# Patient Record
Sex: Male | Born: 1990 | Race: White | Hispanic: No | Marital: Single | Smoking: Current some day smoker
Health system: Southern US, Community
[De-identification: ages and names within clinical notes are randomized; demographics above are authoritative.]

## PROBLEM LIST (undated history)

## (undated) DIAGNOSIS — F32A Depression, unspecified: Secondary | ICD-10-CM

## (undated) DIAGNOSIS — E119 Type 2 diabetes mellitus without complications: Secondary | ICD-10-CM

## (undated) DIAGNOSIS — I10 Essential (primary) hypertension: Secondary | ICD-10-CM

## (undated) HISTORY — DX: Essential (primary) hypertension: I10

---
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
## 2010-07-26 LAB — URINALYSIS, ROUTINE W REFLEX MICROSCOPIC
Bilirubin Urine: NEGATIVE
Glucose, UA: NEGATIVE mg/dL
Specific Gravity, Urine: >1.03 — ABNORMAL HIGH (ref 1.005–1.030)
pH: 5.5 (ref 5.0–8.0)

## 2011-05-13 ENCOUNTER — Emergency Department (HOSPITAL_COMMUNITY): Payer: Self-pay

## 2011-05-13 ENCOUNTER — Encounter (HOSPITAL_COMMUNITY): Payer: Self-pay

## 2011-05-13 ENCOUNTER — Emergency Department (HOSPITAL_COMMUNITY)
Admission: EM | Admit: 2011-05-13 | Discharge: 2011-05-13 | Disposition: A | Discharge status: Home / Self Care | Payer: Self-pay | Attending: Emergency Medicine | Admitting: Emergency Medicine

## 2011-05-13 DIAGNOSIS — Y93H2 Activity, gardening and landscaping: Secondary | ICD-10-CM | POA: Insufficient documentation

## 2011-05-13 DIAGNOSIS — S43409A Unspecified sprain of unspecified shoulder joint, initial encounter: Secondary | ICD-10-CM

## 2011-05-13 DIAGNOSIS — IMO0001 Reserved for inherently not codable concepts without codable children: Secondary | ICD-10-CM | POA: Insufficient documentation

## 2011-05-13 DIAGNOSIS — IMO0002 Reserved for concepts with insufficient information to code with codable children: Secondary | ICD-10-CM | POA: Insufficient documentation

## 2011-05-13 DIAGNOSIS — X58XXXA Exposure to other specified factors, initial encounter: Secondary | ICD-10-CM | POA: Insufficient documentation

## 2011-05-13 DIAGNOSIS — M549 Dorsalgia, unspecified: Secondary | ICD-10-CM | POA: Insufficient documentation

## 2011-05-13 MED ORDER — OXYCODONE-ACETAMINOPHEN 7.5-325 MG PO TABS: 1.0000 | ORAL_TABLET | ORAL | Status: AC | PRN

## 2011-05-13 MED ORDER — HYDROCODONE-ACETAMINOPHEN 5-325 MG PO TABS
ORAL_TABLET | ORAL | Status: AC
Start: 2011-05-13 — End: 2011-05-13
  Administered 2011-05-13: 2 via ORAL
  Filled 2011-05-13: qty 2

## 2011-05-13 MED ORDER — HYDROCODONE-ACETAMINOPHEN 5-325 MG PO TABS
2.0000 | ORAL_TABLET | Freq: Once | ORAL | Status: AC
  Administered 2011-05-13: 2 via ORAL

## 2011-05-13 NOTE — ED Notes (Signed)
Pain rt shoulder and arm after using a log splitter.

## 2011-05-13 NOTE — ED Notes (Signed)
Pt reports was cutting wood with wood splitter and c/o pain to r arm and mid back.

## 2011-05-17 NOTE — ED Provider Notes (Signed)
History     CSN: [id number]  Arrival date & time 05/13/11  1631   First MD Initiated Contact with Patient 05/13/11 1700      Chief Complaint  Patient presents with  . Arm Pain  . Back Pain    (Consider location/radiation/quality/duration/timing/severity/associated sxs/prior treatment) HPI Comments: Patient c/o pain to his right upper arm and shoulder joint that began after he was using a wood splitter for several hours.  Pain is reproduced with movement, specifically abduction of the right arm.  He denies fall, numbness, weakness or neck pain.    Patient is a 21 y.o. male presenting with arm pain and back pain. The history is provided by the patient. No language interpreter was used.  Arm Pain This is a new problem. The current episode started today. The problem occurs constantly. The problem has been unchanged. Associated symptoms include arthralgias and myalgias. Pertinent negatives include no fever, headaches, joint swelling, neck pain, numbness or weakness. The symptoms are aggravated by twisting (movment and palpation). He has tried nothing for the symptoms. The treatment provided no relief.  Back Pain  Pertinent negatives include no fever, no numbness, no headaches and no weakness.    History reviewed. No pertinent past medical history.  History reviewed. No pertinent past surgical history.  No family history on file.  History  Substance Use Topics  . Smoking status: Current Everyday Smoker  . Smokeless tobacco: Not on file  . Alcohol Use: No      Review of Systems  Constitutional: Negative for fever.  HENT: Negative for neck pain.   Musculoskeletal: Positive for myalgias, back pain and arthralgias. Negative for joint swelling.  Skin: Negative.   Neurological: Negative for weakness, numbness and headaches.  All other systems reviewed and are negative.    Allergies  Aspirin  Home Medications   Current Outpatient Rx  Name Route Sig Dispense Refill  .  OXYCODONE-ACETAMINOPHEN 7.5-325 MG PO TABS Oral Take 1 tablet by mouth every 4 (four) hours as needed for pain. 24 tablet 0    BP 116/86  Pulse 99  Temp(Src) 98.3 F (36.8 C) (Oral)  Resp 18  Ht 6' (1.829 m)  Wt 270 lb (122.471 kg)  BMI 36.62 kg/m2  SpO2 100%  Physical Exam  Nursing note and vitals reviewed. Constitutional: He is oriented to person, place, and time. He appears well-developed and well-nourished. No distress.  HENT:  Head: Normocephalic and atraumatic.  Mouth/Throat: Oropharynx is clear and moist.  Neck: Normal range of motion. Neck supple.  Cardiovascular: Normal rate, regular rhythm and normal heart sounds.   Pulmonary/Chest: Effort normal and breath sounds normal. No respiratory distress.  Musculoskeletal: He exhibits tenderness. He exhibits no edema.       Right shoulder: He exhibits decreased range of motion and tenderness. He exhibits no swelling, no effusion, no crepitus, no deformity, no laceration, normal pulse and normal strength.       Arms:      ttp of the anterior bursa of the right shoulder and muscles surrounding the right scapular border.  Pain also reproduced with abduction of the right arm.  Grip strength is symmetrical   Lymphadenopathy:    He has no cervical adenopathy.  Neurological: He is alert and oriented to person, place, and time. He has normal strength. No sensory deficit. He exhibits normal muscle tone. Coordination and gait normal.  Reflex Scores:      Tricep reflexes are 2+ on the right side and 2+ on the left  side.      Bicep reflexes are 2+ on the right side and 2+ on the left side. Skin: Skin is warm and dry.    ED Course  Procedures (including critical care time)  Dg Shoulder Right  05/13/2011  *RADIOLOGY REPORT*  Clinical Data: History of injury and pain.  RIGHT SHOULDER - 2+ VIEW  Comparison: 08/11/2009.  Findings: Alignment is normal.  Joint spaces are preserved.  No fracture or dislocation is evident.  No soft tissue lesions  are seen.  IMPRESSION: No right shoulder lesion is evident.  Original Report Authenticated By: Crawford Givens, M.D.    1. Shoulder sprain       MDM    Pt agrees to f/u with orthopedics if the pain is not improving.  Likely muscular strain.    Patient / Family / Caregiver understand and agree with initial ED impression and plan with expectations set for ED visit. Pt stable in ED with no significant deterioration in condition.       Samuel Wood L. Samuel Wood, [place] 05/17/11 1704

## 2011-05-18 NOTE — ED Provider Notes (Signed)
Medical screening examination/treatment/procedure(s) were performed by non-physician practitioner and as supervising physician I was immediately available for consultation/collaboration.   Jaselle Pryer, MD 05/18/11 0838 

## 2011-09-03 ENCOUNTER — Emergency Department (HOSPITAL_COMMUNITY)
Admission: EM | Admit: 2011-09-03 | Discharge: 2011-09-03 | Disposition: A | Discharge status: Home / Self Care | Payer: Self-pay | Attending: Emergency Medicine | Admitting: Emergency Medicine

## 2011-09-03 ENCOUNTER — Encounter (HOSPITAL_COMMUNITY): Payer: Self-pay | Admitting: *Deleted

## 2011-09-03 DIAGNOSIS — W1809XA Striking against other object with subsequent fall, initial encounter: Secondary | ICD-10-CM | POA: Insufficient documentation

## 2011-09-03 DIAGNOSIS — F17200 Nicotine dependence, unspecified, uncomplicated: Secondary | ICD-10-CM | POA: Insufficient documentation

## 2011-09-03 DIAGNOSIS — Y990 Civilian activity done for income or pay: Secondary | ICD-10-CM | POA: Insufficient documentation

## 2011-09-03 DIAGNOSIS — M545 Low back pain, unspecified: Secondary | ICD-10-CM | POA: Insufficient documentation

## 2011-09-03 DIAGNOSIS — Y9289 Other specified places as the place of occurrence of the external cause: Secondary | ICD-10-CM | POA: Insufficient documentation

## 2011-09-03 MED ORDER — DIAZEPAM 5 MG PO TABS: 5.0000 mg | ORAL_TABLET | Freq: Two times a day (BID) | ORAL | Status: AC

## 2011-09-03 MED ORDER — OXYCODONE-ACETAMINOPHEN 5-325 MG PO TABS
2.0000 | ORAL_TABLET | Freq: Once | ORAL | Status: AC
  Administered 2011-09-03: 2 via ORAL
  Filled 2011-09-03: qty 2

## 2011-09-03 MED ORDER — DIAZEPAM 5 MG/ML IJ SOLN
5.0000 mg | Freq: Once | INTRAMUSCULAR | Status: AC
  Administered 2011-09-03: 5 mg via INTRAMUSCULAR
  Filled 2011-09-03: qty 2

## 2011-09-03 MED ORDER — OXYCODONE-ACETAMINOPHEN 5-325 MG PO TABS: 1.0000 | ORAL_TABLET | Freq: Four times a day (QID) | ORAL | Status: AC | PRN

## 2011-09-03 NOTE — Discharge Instructions (Signed)
Followup with Primary Care Physician. If you do not have one see the list below.  Use conservative methods at home including heat therapy and cold therapy as we discussed. More information on cold therapy is listed below.  It is not recommended to use heat treatment directly after an acute injury.  SEEK IMMEDIATE MEDICAL ATTENTION IF: New numbness, tingling, weakness, or problem with the use of your arms or legs.  Severe back pain not relieved with medications.  Change in bowel or bladder control.  Increasing pain in any areas of the body (such as chest or abdominal pain).  Shortness of breath, dizziness or fainting.  Nausea (feeling sick to your stomach), vomiting, fever, or sweats.  COLD THERAPY DIRECTIONS:  Ice or gel packs can be used to reduce both pain and swelling. Ice is the most helpful within the first 24 to 48 hours after an injury or flareup from overusing a muscle or joint.  Ice is effective, has very few side effects, and is safe for most people to use.   If you expose your skin to cold temperatures for too long or without the proper protection, you can damage your skin or nerves. Watch for signs of skin damage due to cold.   HOME CARE INSTRUCTIONS  Follow these tips to use ice and cold packs safely.  Place a dry or damp towel between the ice and skin. A damp towel will cool the skin more quickly, so you may need to shorten the time that the ice is used.  For a more rapid response, add gentle compression to the ice.  Ice for no more than 10 to 20 minutes at a time. The bonier the area you are icing, the less time it will take to get the benefits of ice.  Check your skin after 5 minutes to make sure there are no signs of a poor response to cold or skin damage.  Rest 20 minutes or more in between uses.  Once your skin is numb, you can end your treatment. You can test numbness by very lightly touching your skin. The touch should be so light that you do not see the skin dimple from  the pressure of your fingertip. When using ice, most people will feel these normal sensations in this order: cold, burning, aching, and numbness.  Do not use ice on someone who cannot communicate their responses to pain, such as small children or people with dementia.   HOW TO MAKE AN ICE PACK  To make an ice pack, do one of the following:  Place crushed ice or a bag of frozen vegetables in a sealable plastic bag. Squeeze out the excess air. Place this bag inside another plastic bag. Slide the bag into a pillowcase or place a damp towel between your skin and the bag.  Mix 3 parts water with 1 part rubbing alcohol. Freeze the mixture in a sealable plastic bag. When you remove the mixture from the freezer, it will be slushy. Squeeze out the excess air. Place this bag inside another plastic bag. Slide the bag into a pillowcase or place a damp towel between your skin and the bag.   SEEK MEDICAL CARE IF:  You develop white spots on your skin. This may give the skin a blotchy (mottled) appearance.  Your skin turns blue or pale.  Your skin becomes waxy or hard.  Your swelling gets worse.  MAKE SURE YOU:  Understand these instructions.  Will watch your condition.  Will get  help right away if you are not doing well or get worse.

## 2011-09-03 NOTE — ED Notes (Signed)
Pt in c/o lower back pain after lifting logs at work, no history of same, increased pain with movement

## 2011-09-03 NOTE — ED Notes (Signed)
Pt. 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  Pain is in lumbar and sacral area.   Rates pain as a 10.  Denies any tingling or numbness.

## 2011-09-03 NOTE — ED Notes (Signed)
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

## 2011-09-03 NOTE — ED Provider Notes (Signed)
History     CSN: [id number]  Arrival date & time 09/03/11  2132   First MD Initiated Contact with Patient 09/03/11 2153      Chief Complaint  Patient presents with  . Back Pain    (Consider location/radiation/quality/duration/timing/severity/associated sxs/prior treatment) HPI Comments: Patient reports that earlier today he was lifting heavy logs while at work.  While lifting a log he feels that he threw his back out.  He is now having pain across his lower back.  He reports that the pain feels like a pulled muscle.  He has not taken anything for the pain.    Patient is a 21 y.o. male presenting with back pain. The history is provided by the patient.  Back Pain  This is a new problem. The current episode started 3 to 5 hours ago. The problem occurs constantly. The problem has not changed since onset.The pain is associated with lifting heavy objects. The pain is present in the lumbar spine. The quality of the pain is described as stabbing. The pain does not radiate. Pertinent negatives include no fever, no numbness, no abdominal pain, no bowel incontinence, no perianal numbness, no bladder incontinence, no dysuria, no paresthesias, no paresis, no tingling and no weakness. He has tried nothing for the symptoms.    History reviewed. No pertinent past medical history.  History reviewed. No pertinent past surgical history.  History reviewed. No pertinent family history.  History  Substance Use Topics  . Smoking status: Current Everyday Smoker  . Smokeless tobacco: Not on file  . Alcohol Use: No      Review of Systems  Constitutional: Negative for fever and chills.  Gastrointestinal: Negative for nausea, vomiting, abdominal pain and bowel incontinence.  Genitourinary: Negative for bladder incontinence and dysuria.  Musculoskeletal: Positive for back pain. Negative for joint swelling and gait problem.  Skin: Negative for color change.  Neurological: Negative for tingling,  weakness, numbness and paresthesias.    Allergies  Aspirin  Home Medications  No current outpatient prescriptions on file.  BP 127/77  Pulse 73  Temp(Src) 97.6 F (36.4 C) (Oral)  Resp 18  SpO2 99%  Physical Exam  Nursing note and vitals reviewed. Constitutional: He is oriented to person, place, and time. He appears well-developed and well-nourished. No distress.  HENT:  Head: Normocephalic and atraumatic.  Eyes: Conjunctivae and EOM are normal. Pupils are equal, round, and reactive to light. No scleral icterus.  Neck: Normal range of motion and full passive range of motion without pain. Neck supple. No spinous process tenderness and no muscular tenderness present. No rigidity. Normal range of motion present. No Brudzinski's sign noted.  Cardiovascular: Normal rate, regular rhythm and intact distal pulses.  Exam reveals no gallop and no friction rub.   No murmur heard. Pulmonary/Chest: Effort normal and breath sounds normal. No respiratory distress. He has no wheezes. He has no rales. He exhibits no tenderness.  Musculoskeletal:       Cervical back: He exhibits normal range of motion, no tenderness, no bony tenderness and no pain.       Thoracic back: He exhibits no tenderness, no bony tenderness and no pain.       Lumbar back: He exhibits tenderness, bony tenderness and pain. He exhibits no spasm and normal pulse.       Bilateral lower extremities nontender without color change, baseline range of motion of extremities with intact distal pulses, capillary refill less than 2 seconds bilaterally.  Pt has increased pain w  ROM of lumbar spine. Pain w ambulation, no sign of ataxia.  Neurological: He is alert and oriented to person, place, and time. He has normal strength and normal reflexes. No sensory deficit. Gait (no ataxia, slowed and hunched d/t pain ) abnormal.       sensation at baseline for light touch in all 4 distal extremities, motor symmetric & bilateral 5/5   Abduction,adduction,flexion of hips, knee flexion & extension, foot dorsiflexion, foot plantar flexion.  Skin: Skin is warm and dry. No rash noted. He is not diaphoretic. No erythema. No pallor.  Psychiatric: He has a normal mood and affect.    ED Course  Procedures (including critical care time)  Labs Reviewed - No data to display No results found.   No diagnosis found.    MDM  Patient with back pain.  No neurological deficits and normal neuro exam.  Patient can walk but states is painful.  No loss of bowel or bladder control.  No concern for cauda equina.  No fever, night sweats, weight loss, h/o cancer, IVDU.  RICE protocol and pain medicine indicated and discussed with patient. Suspect that pain is muscle strain.  Patient given prescription for muscle relaxer.          Pascal Lux Melbeta, PA-C 09/05/11 [phone]

## 2011-09-09 NOTE — ED Provider Notes (Signed)
History/physical exam/procedure(s) were performed by non-physician practitioner and as supervising physician I was immediately available for consultation/collaboration. I have reviewed all notes and am in agreement with care and plan.   Hilario Quarry, MD 09/09/11 [phone]

## 2011-10-09 ENCOUNTER — Encounter (HOSPITAL_COMMUNITY): Payer: Self-pay | Admitting: *Deleted

## 2011-10-09 ENCOUNTER — Emergency Department (HOSPITAL_COMMUNITY)
Admission: EM | Admit: 2011-10-09 | Discharge: 2011-10-09 | Disposition: A | Discharge status: Home / Self Care | Payer: Self-pay | Attending: Emergency Medicine | Admitting: Emergency Medicine

## 2011-10-09 DIAGNOSIS — M5431 Sciatica, right side: Secondary | ICD-10-CM

## 2011-10-09 DIAGNOSIS — Y990 Civilian activity done for income or pay: Secondary | ICD-10-CM | POA: Insufficient documentation

## 2011-10-09 DIAGNOSIS — X500XXA Overexertion from strenuous movement or load, initial encounter: Secondary | ICD-10-CM | POA: Insufficient documentation

## 2011-10-09 DIAGNOSIS — M5430 Sciatica, unspecified side: Secondary | ICD-10-CM | POA: Insufficient documentation

## 2011-10-09 DIAGNOSIS — S39012A Strain of muscle, fascia and tendon of lower back, initial encounter: Secondary | ICD-10-CM

## 2011-10-09 DIAGNOSIS — Y9269 Other specified industrial and construction area as the place of occurrence of the external cause: Secondary | ICD-10-CM | POA: Insufficient documentation

## 2011-10-09 DIAGNOSIS — S335XXA Sprain of ligaments of lumbar spine, initial encounter: Secondary | ICD-10-CM | POA: Insufficient documentation

## 2011-10-09 MED ORDER — PREDNISONE 20 MG PO TABS
60.0000 mg | ORAL_TABLET | Freq: Once | ORAL | Status: AC
  Administered 2011-10-09: 60 mg via ORAL
  Filled 2011-10-09: qty 3

## 2011-10-09 MED ORDER — HYDROCODONE-ACETAMINOPHEN 5-325 MG PO TABS: ORAL_TABLET | ORAL | Status: DC

## 2011-10-09 MED ORDER — HYDROCODONE-ACETAMINOPHEN 5-325 MG PO TABS
1.0000 | ORAL_TABLET | Freq: Once | ORAL | Status: AC
  Administered 2011-10-09: 1 via ORAL
  Filled 2011-10-09: qty 1

## 2011-10-09 MED ORDER — PREDNISONE 50 MG PO TABS: ORAL_TABLET | ORAL | Status: AC

## 2011-10-09 NOTE — ED Notes (Signed)
Pain in low back and down rt leg.  After trying to pick up a log at work

## 2011-10-09 NOTE — Discharge Instructions (Signed)
Back Pain, Adult Low back pain is very common. About 1 in 5 people have back pain.The cause of low back pain is rarely dangerous. The pain often gets better over time.About half of people with a sudden onset of back pain feel better in just 2 weeks. About 8 in 10 people feel better by 6 weeks.  CAUSES Some common causes of back pain include:  Strain of the muscles or ligaments supporting the spine.   Wear and tear (degeneration) of the spinal discs.   Arthritis.   Direct injury to the back.  DIAGNOSIS Most of the time, the direct cause of low back pain is not known.However, back pain can be treated effectively even when the exact cause of the pain is unknown.Answering your caregiver's questions about your overall health and symptoms is one of the most accurate ways to make sure the cause of your pain is not dangerous. If your caregiver needs more information, he or she may order lab work or imaging tests (X-rays or MRIs).However, even if imaging tests show changes in your back, this usually does not require surgery. HOME CARE INSTRUCTIONS For many people, back pain returns.Since low back pain is rarely dangerous, it is often a condition that people can learn to manageon their own.   Remain active. It is stressful on the back to sit or stand in one place. Do not sit, drive, or stand in one place for more than 30 minutes at a time. Take short walks on level surfaces as soon as pain allows.Try to increase the length of time you walk each day.   Do not stay in bed.Resting more than 1 or 2 days can delay your recovery.   Do not avoid exercise or work.Your body is made to move.It is not dangerous to be active, even though your back may hurt.Your back will likely heal faster if you return to being active before your pain is gone.   Pay attention to your body when you bend and lift. Many people have less discomfortwhen lifting if they bend their knees, keep the load close to their  bodies,and avoid twisting. Often, the most comfortable positions are those that put less stress on your recovering back.   Find a comfortable position to sleep. Use a firm mattress and lie on your side with your knees slightly bent. If you lie on your back, put a pillow under your knees.   Only take over-the-counter or prescription medicines as directed by your caregiver. Over-the-counter medicines to reduce pain and inflammation are often the most helpful.Your caregiver may prescribe muscle relaxant drugs.These medicines help dull your pain so you can more quickly return to your normal activities and healthy exercise.   Put ice on the injured area.   Put ice in a plastic bag.   Place a towel between your skin and the bag.   Leave the ice on for 15 to 20 minutes, 3 to 4 times a day for the first 2 to 3 days. After that, ice and heat may be alternated to reduce pain and spasms.   Ask your caregiver about trying back exercises and gentle massage. This may be of some benefit.   Avoid feeling anxious or stressed.Stress increases muscle tension and can worsen back pain.It is important to recognize when you are anxious or stressed and learn ways to manage it.Exercise is a great option.  SEEK MEDICAL CARE IF:  You have pain that is not relieved with rest or medicine.   You have   pain that does not improve in 1 week.   You have new symptoms.   You are generally not feeling well.  SEEK IMMEDIATE MEDICAL CARE IF:   You have pain that radiates from your back into your legs.   You develop new bowel or bladder control problems.   You have unusual weakness or numbness in your arms or legs.   You develop nausea or vomiting.   You develop abdominal pain.   You feel faint.  Document Released: 04/06/2005 Document Revised: 03/26/2011 Document Reviewed: 08/25/2010 [facility] Patient Information 2012 [place], [place].   Take the meds as directed.  Do NOT take any NSAID's while taking the  prednisone.  Apply ice 20-30 min several times daily to lower back.  Follow up with either dr. Hilda Lias or Romeo Apple as needed.

## 2011-10-09 NOTE — ED Provider Notes (Signed)
History     CSN: [id number]  Arrival date & time 10/09/11  1401   First MD Initiated Contact with Patient 10/09/11 1429      Chief Complaint  Patient presents with  . 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  Radiation to right knee.  Worse with movement.  Worse since taking hot shower.  Patient is a 21 y.o. male presenting with back pain. The history is provided by the patient. No language interpreter was used.  Back Pain  This is a new problem. The current episode started 3 to 5 hours ago. The problem occurs constantly. The problem has been gradually worsening. The pain is associated with lifting heavy objects. The pain is severe. The symptoms are aggravated by bending, twisting and certain positions. Associated symptoms include numbness. Pertinent negatives include no fever and no weakness. He has tried heat for the symptoms. The treatment provided no relief.    History reviewed. No pertinent past medical history.  History reviewed. No pertinent past surgical history.  History reviewed. No pertinent family history.  History  Substance Use Topics  . Smoking status: Current Everyday Smoker  . Smokeless tobacco: Not on file  . Alcohol Use: No      Review of Systems  Constitutional: Negative for fever.  Musculoskeletal: Positive for back pain.  Neurological: Positive for numbness. Negative for weakness.  All other systems reviewed and are negative.    Allergies  Chantix and Aspirin  Home Medications   Current Outpatient Rx  Name Route Sig Dispense Refill  . HYDROCODONE-ACETAMINOPHEN 5-325 MG PO TABS  One tab po q 4-6 hrs prn pain 20 tablet 0  . PREDNISONE 50 MG PO TABS  One tab po QD 6 tablet 0    BP 128/72  Pulse 106  Temp 97.3 F (36.3 C) (Oral)  Resp 20   Ht 6' (1.829 m)  Wt 275 lb (124.739 kg)  BMI 37.30 kg/m2  SpO2 100%  Physical Exam  Nursing note and vitals reviewed. Constitutional: He is oriented to person, place, and time. He appears well-developed and well-nourished.  HENT:  Head: Normocephalic and atraumatic.  Eyes: EOM are normal.  Neck: Normal range of motion.  Cardiovascular: Normal rate, regular rhythm, normal heart sounds and intact distal pulses.   Pulmonary/Chest: Effort normal and breath sounds normal. No respiratory distress.  Abdominal: Soft. He exhibits no distension. There is no tenderness.  Musculoskeletal: Normal range of motion.       Back:  Neurological: He is alert and oriented to person, place, and time.  Skin: Skin is warm and dry.  Psychiatric: He has a normal mood and affect. Judgment normal.    ED Course  Procedures (including critical care time)  Labs Reviewed - No data to display No results found.   1. Lumbar strain   2. Right sided sciatica       MDM  Doubt fx.  Exam c/w muscle strain and sciatica. rx-prednisone 50 mg , 6 rx- hydrocodone, 20 Ice F/u with ortho prn        Worthy Rancher, [place] 10/09/11 1446

## 2011-10-09 NOTE — ED Provider Notes (Signed)
Medical screening examination/treatment/procedure(s) were performed by non-physician practitioner and as supervising physician I was immediately available for consultation/collaboration.   Shelda Jakes, MD 10/09/11 1447

## 2011-10-27 ENCOUNTER — Encounter (HOSPITAL_COMMUNITY): Payer: Self-pay | Admitting: Emergency Medicine

## 2011-10-27 ENCOUNTER — Emergency Department (HOSPITAL_COMMUNITY)
Admission: EM | Admit: 2011-10-27 | Discharge: 2011-10-27 | Disposition: A | Discharge status: Home / Self Care | Payer: Self-pay | Attending: Emergency Medicine | Admitting: Emergency Medicine

## 2011-10-27 DIAGNOSIS — S39012A Strain of muscle, fascia and tendon of lower back, initial encounter: Secondary | ICD-10-CM

## 2011-10-27 DIAGNOSIS — S335XXA Sprain of ligaments of lumbar spine, initial encounter: Secondary | ICD-10-CM | POA: Insufficient documentation

## 2011-10-27 DIAGNOSIS — X58XXXA Exposure to other specified factors, initial encounter: Secondary | ICD-10-CM | POA: Insufficient documentation

## 2011-10-27 DIAGNOSIS — F17200 Nicotine dependence, unspecified, uncomplicated: Secondary | ICD-10-CM | POA: Insufficient documentation

## 2011-10-27 MED ORDER — MORPHINE SULFATE 10 MG/ML IJ SOLN
INTRAMUSCULAR | Status: AC
  Administered 2011-10-27: 10 mg
  Filled 2011-10-27: qty 1

## 2011-10-27 MED ORDER — BACLOFEN 10 MG PO TABS: 10.0000 mg | ORAL_TABLET | Freq: Three times a day (TID) | ORAL | Status: DC

## 2011-10-27 MED ORDER — METHOCARBAMOL 500 MG PO TABS
1000.0000 mg | ORAL_TABLET | Freq: Once | ORAL | Status: AC
  Administered 2011-10-27: 1000 mg via ORAL
  Filled 2011-10-27: qty 2

## 2011-10-27 MED ORDER — MORPHINE SULFATE 2 MG/ML IJ SOLN: 8.0000 mg | Freq: Once | INTRAMUSCULAR | Status: DC

## 2011-10-27 MED ORDER — HYDROCODONE-ACETAMINOPHEN 5-325 MG PO TABS: 1.0000 | ORAL_TABLET | ORAL | Status: DC | PRN

## 2011-10-27 MED ORDER — ONDANSETRON HCL 4 MG PO TABS
4.0000 mg | ORAL_TABLET | Freq: Once | ORAL | Status: AC
  Administered 2011-10-27: 4 mg via ORAL
  Filled 2011-10-27: qty 1

## 2011-10-27 NOTE — ED Provider Notes (Signed)
History     CSN: [id number]  Arrival date & time 10/27/11  1911   First MD Initiated Contact with Patient 10/27/11 2116      Chief Complaint  Patient presents with  . Back Pain    (Consider location/radiation/quality/duration/timing/severity/associated sxs/prior treatment) Patient is a 21 y.o. male presenting with back pain. The history is provided by the patient.  Back Pain  This is a new problem. The current episode started 1 to 2 hours ago. The problem occurs constantly. The problem has not changed since onset.The pain is associated with lifting heavy objects. The quality of the pain is described as shooting and aching. The pain does not radiate. The pain is severe. The symptoms are aggravated by certain positions. The pain is the same all the time. Pertinent negatives include no chest pain, no abdominal pain and no dysuria. He has tried nothing for the symptoms.    History reviewed. No pertinent past medical history.  History reviewed. No pertinent past surgical history.  No family history on file.  History  Substance Use Topics  . Smoking status: Current Everyday Smoker  . Smokeless tobacco: Not on file  . Alcohol Use: No      Review of Systems  Constitutional: Negative for activity change.       All ROS Neg except as noted in HPI  HENT: Negative for nosebleeds and neck pain.   Eyes: Negative for photophobia and discharge.  Respiratory: Positive for cough. Negative for shortness of breath and wheezing.   Cardiovascular: Negative for chest pain and palpitations.  Gastrointestinal: Negative for abdominal pain and blood in stool.  Genitourinary: Negative for dysuria, frequency and hematuria.  Musculoskeletal: Positive for back pain. Negative for arthralgias.  Skin: Negative.   Neurological: Negative for dizziness, seizures and speech difficulty.  Psychiatric/Behavioral: Negative for hallucinations and confusion.    Allergies  Chantix and Aspirin  Home Medications    Current Outpatient Rx  Name Route Sig Dispense Refill  . HYDROCODONE-ACETAMINOPHEN 5-325 MG PO TABS  One tab po q 4-6 hrs prn pain 20 tablet 0    BP 118/80  Pulse 96  Temp 99.6 F (37.6 C) (Oral)  Resp 14  Ht 6' (1.829 m)  Wt 280 lb (127.007 kg)  BMI 37.97 kg/m2  SpO2 98%  Physical Exam  Nursing note and vitals reviewed. Constitutional: He is oriented to person, place, and time. He appears well-developed and well-nourished.  Non-toxic appearance.  HENT:  Head: Normocephalic.  Right Ear: Tympanic membrane and external ear normal.  Left Ear: Tympanic membrane and external ear normal.  Eyes: EOM and lids are normal. Pupils are equal, round, and reactive to light.  Neck: Normal range of motion. Neck supple. Carotid bruit is not present.  Cardiovascular: Normal rate, regular rhythm, normal heart sounds, intact distal pulses and normal pulses.   Pulmonary/Chest: Breath sounds normal. No respiratory distress.  Abdominal: Soft. Bowel sounds are normal. There is no tenderness. There is no guarding.  Musculoskeletal: Normal range of motion.       Spasm with attempted ROM of the lumbar spine and paraspinal area on the right. No palpable step off.  Lymphadenopathy:       Head (right side): No submandibular adenopathy present.       Head (left side): No submandibular adenopathy present.    He has no cervical adenopathy.  Neurological: He is alert and oriented to person, place, and time. He has normal strength. No cranial nerve deficit or sensory deficit. He exhibits  normal muscle tone. Coordination normal.  Skin: Skin is warm and dry.  Psychiatric: He has a normal mood and affect. His speech is normal.    ED Course  Procedures (including critical care time)  Labs Reviewed - No data to display No results found.   No diagnosis found.    MDM  I have reviewed nursing notes, vital signs, and all appropriate lab and imaging results for this patient. Pain much improved after IM  morphine and po robaxin. Pt to alternate heat and ice. Rx for baclofen and norco 5mg  given to the patient.       Samuel Wood, [place] 11/09/11 1640

## 2011-10-27 NOTE — ED Notes (Signed)
Discharge instructions reviewed with pt, questions answered. Pt verbalized understanding.  

## 2011-10-27 NOTE — ED Notes (Signed)
States he was lifting a heavy object about 1 hour ago and feels like he has injured his lower back.

## 2011-11-02 ENCOUNTER — Emergency Department (HOSPITAL_COMMUNITY): Payer: Self-pay

## 2011-11-02 ENCOUNTER — Encounter (HOSPITAL_COMMUNITY): Payer: Self-pay | Admitting: *Deleted

## 2011-11-02 ENCOUNTER — Emergency Department (HOSPITAL_COMMUNITY)
Admission: EM | Admit: 2011-11-02 | Discharge: 2011-11-02 | Disposition: A | Discharge status: Home / Self Care | Payer: Self-pay | Attending: Emergency Medicine | Admitting: Emergency Medicine

## 2011-11-02 DIAGNOSIS — M545 Low back pain, unspecified: Secondary | ICD-10-CM | POA: Insufficient documentation

## 2011-11-02 DIAGNOSIS — G8929 Other chronic pain: Secondary | ICD-10-CM | POA: Insufficient documentation

## 2011-11-02 DIAGNOSIS — F17200 Nicotine dependence, unspecified, uncomplicated: Secondary | ICD-10-CM | POA: Insufficient documentation

## 2011-11-02 MED ORDER — CYCLOBENZAPRINE HCL 10 MG PO TABS: 10.0000 mg | ORAL_TABLET | Freq: Three times a day (TID) | ORAL | Status: AC | PRN

## 2011-11-02 MED ORDER — HYDROCODONE-ACETAMINOPHEN 5-325 MG PO TABS: ORAL_TABLET | ORAL | Status: AC

## 2011-11-02 MED ORDER — OXYCODONE-ACETAMINOPHEN 5-325 MG PO TABS
1.0000 | ORAL_TABLET | Freq: Once | ORAL | Status: AC
  Administered 2011-11-02: 1 via ORAL
  Filled 2011-11-02: qty 1

## 2011-11-02 NOTE — ED Provider Notes (Signed)
History   This chart was scribed for Samuel Anger, DO by Samuel Wood. The patient was seen in room APA16A/APA16A.      CSN: [id number]  Arrival date & time 11/02/11  1620   First MD Initiated Contact with Patient 11/02/11 1716      Chief Complaint  Patient presents with  . Back Pain     HPI Pt was seen at 1735. Samuel Wood is a 21 y.o. male who presents to the Emergency Department complaining of gradual onset and persistence of constant acute flair of his chronic right sided lower back "pain" that began approx 2 hours ago.  Pt states he was swimming with friends, and he [place] and [place]ed on a log." States he "landed on my back."  Denies any change in his usual chronic pain pattern.  Pain worsens with palpation of the area and body position changes. Denies incont/retention of bowel or bladder, no saddle anesthesia, no focal motor weakness, no tingling/numbness in extremities, no fevers.   The symptoms have been associated with no other complaints. The patient has a significant history of similar symptoms previously, recently being evaluated for this complaint and multiple prior evals for same.       History reviewed. No pertinent past medical history.  History reviewed. No pertinent past surgical history.   History  Substance Use Topics  . Smoking status: Current Everyday Smoker  . Smokeless tobacco: Not on file  . Alcohol Use: No     Review of Systems ROS: Statement: All systems negative except as marked or noted in the HPI; Constitutional: Negative for fever and chills. ; ; Eyes: Negative for eye pain, redness and discharge. ; ; ENMT: Negative for ear pain, hoarseness, nasal congestion, sinus pressure and sore throat. ; ; Cardiovascular: Negative for chest pain, palpitations, diaphoresis, dyspnea and peripheral edema. ; ; Respiratory: Negative for cough, wheezing and stridor. ; ; Gastrointestinal: Negative for nausea, vomiting, diarrhea,  abdominal pain, blood in stool, hematemesis, jaundice and rectal bleeding. . ; ; Genitourinary: Negative for dysuria, flank pain and hematuria. ; ; Musculoskeletal: +LBP.  Negative for neck pain. Negative for swelling and trauma.; ; Skin: Negative for pruritus, rash, abrasions, blisters, bruising and skin lesion.; ; Neuro: Negative for headache, lightheadedness and neck stiffness. Negative for weakness, altered level of consciousness , altered mental status, extremity weakness, paresthesias, involuntary movement, seizure and syncope.      Allergies  Chantix and Aspirin  Home Medications   Current Outpatient Rx  Name Route Sig Dispense Refill  . BACLOFEN 10 MG PO TABS Oral Take 1 tablet (10 mg total) by mouth 3 (three) times daily. 21 each 0  . HYDROCODONE-ACETAMINOPHEN 5-325 MG PO TABS  One tab po q 4-6 hrs prn pain 20 tablet 0  . HYDROCODONE-ACETAMINOPHEN 5-325 MG PO TABS Oral Take 1 tablet by mouth every 4 (four) hours as needed for pain. 15 tablet 0    BP 118/63  Pulse 72  Temp 97.4 F (36.3 C)  Resp 20  Ht 6' (1.829 m)  Wt 280 lb (127.007 kg)  BMI 37.97 kg/m2  SpO2 100%  Physical Exam 1740: Physical examination:  Nursing notes reviewed; Vital signs and O2 SAT reviewed;  Constitutional: Well developed, Well nourished, Well hydrated, In no acute distress; Head:  Normocephalic, atraumatic; Eyes: EOMI, PERRL, No scleral icterus; ENMT: Mouth and pharynx normal, Mucous membranes moist; Neck: Supple, Full range of motion, No lymphadenopathy; Cardiovascular: Regular rate and rhythm,  No murmur, rub, or gallop; Respiratory: Breath sounds clear & equal bilaterally, No rales, rhonchi, wheezes.  Speaking full sentences with ease, Normal respiratory effort/excursion; Chest: Nontender, Movement normal; Abdomen: Soft, Nontender, Nondistended, Normal bowel sounds; Genitourinary: No CVA tenderness; Spine:  No midline CS, TS, LS tenderness. +TTP right and left lumbar paraspinal muscles, no ecchymosis,  no erythema, no abrasions, no soft tissue crepitus.;; Extremities: Pulses normal, No tenderness, No edema, No calf edema or asymmetry.; Neuro: AA&Ox3, Major CN grossly intact.  Speech clear. Strength 5/5 equal bilat UE's and LE's, including great toe dorsiflexion.  DTR 2/4 equal bilat UE's and LE's.  No gross sensory deficits.  Neg straight leg raises bilat. Climbs on and off stretcher by himself without difficulty, gait steady;; Skin: Color normal, Warm, Dry.   ED Course  Procedures   MDM  MDM Reviewed: vitals, nursing note and previous chart Reviewed previous: x-ray Interpretation: x-ray   Dg Lumbar Spine Complete 11/02/2011  *RADIOLOGY REPORT*  Clinical Data: Low back pain after fall.  LUMBAR SPINE - COMPLETE 4+ VIEW  Comparison: 04/16/2009  Findings: The patient has a lumbar rotoscoliosis, unchanged.  There is no fracture, disc space narrowing, subluxation, or facet arthritis.  No change since the prior exam.  IMPRESSION: No acute abnormalities. Lumbar scoliosis.  Original Report Authenticated By: Gwynn Burly, M.D.      6:51 PM:  No acute injury on XR.  Long hx of chronic pain with multiple ED visits for same.  Pt encouraged to f/u with his PMD and Pain Management doctor for good continuity of care and control of his chronic pain.  Verb understanding.       I personally performed the services described in this documentation, which was scribed in my presence. The recorded information has been reviewed and considered. Samuel Yepiz Allison Quarry, DO 11/04/11 2205

## 2011-11-02 NOTE — ED Notes (Signed)
[place],  Landed on a log, Pain rt lower back.

## 2011-11-02 NOTE — Discharge Instructions (Signed)
RESOURCE GUIDE  Chronic Pain Problems: Contact [facility]  [phone] Patients need to be referred by their primary care doctor.  Insufficient Money for Medicine: Contact United Way:  call "211" or [facility] [phone].  No Primary Care Doctor: - Call Health Connect  [phone] - can help you locate a primary care doctor that  accepts your insurance, provides certain services, etc. - Physician Referral Service- [phone]  Agencies that provide inexpensive medical care: - [facility]  [phone] - [facility]  [phone] - [facility]  [phone] - Women's Clinic  [phone] - Planned Parenthood  [phone] Haynes Bast[facility]  [phone]  Medicaid-accepting [facility] Providers: - [facility]- [address], [address]  [phone], Mon-Fri 9am-7pm, Sat 9am-1pm - [facility]- [address], [address] [place]  [phone] - [facility]- [address], [address] [place]  [phone] [facility]- [facility]- [address] [place] [address]  [phone] - Renaye RakersVeita Bland- [address], [address], [phone]  Only accepts [place] patients after they have their name  applied to their card  Self Pay (no insurance) in [place]: - Sickle Cell Patients: Dr Willey BladeEric Dean, [facility]  [address], [phone] - [facility]- [address]  [phone]       Redge Gainer-     [facility] [place]- [address], [address]       -     [facility]- see information above (Speak to Cit[employer]Pam H if you do not have insurance)       -  [facility]- [address], [phone]       -  [facility] [place]- [address],  [phone]       -  [facility]- [address] [place] [address], [phone]       -  Dr Julio Sickssei-Bonsu-  [address], [address], [place], [phone]       -  [facility]- [address], [phone]       -  [facility] - [address] [place] [address], [phone], also [address], [phone]       -    [facility]- [address], [phone], 1st & 3rd Saturday   every month, 10am-1pm  1) Find a Doctor and Pay Out of Pocket Although you won't have to find out who is covered by your insurance plan, it is a good idea to ask around and get recommendations. You will then need to call the office and see if the doctor you have chosen will accept you as a new patient and what types of options they offer for patients who are self-pay. Some doctors offer discounts or will set up payment plans for their patients who do not have insurance, but you will need to ask so you aren't surprised when you get to your appointment.  2) Contact Your Local Health Department Not all health departments have doctors that can see patients for sick visits, but many do, so it is worth a call to see if yours does. If you don't know where your local health department is, you can check in your phone book. The CDC also has a tool to help you locate your state's health department, and many state websites also have  listings of all of their local health departments.  3) Find a Walk-in Clinic If your illness is not likely to be very severe or complicated, you may want to try a walk in clinic. These are popping up all over the country in pharmacies, drugstores, and shopping centers. They're usually staffed by nurse practitioners or physician assistants that have been trained to treat common illnesses and complaints. They're usually fairly quick and inexpensive. However, if you have serious medical issues or chronic medical problems, these are probably not your best option  STD Testing - [facility] Department of [facility] Luis Llorons Torres, STD Clinic, [address] [place], phone [phone] or [phone].  Monday - Friday, call for an appointment. [facility]  Department of [employer], STD Clinic, [address], [place], phone [phone] or [phone].  Monday - Friday, call for an appointment.  Abuse/Neglect: [facility] Child Abuse Hotline (908) 293-8840 [facility] Child Abuse Hotline (414) 337-1068 (After Hours)  Emergency Shelter:  [facility] [phone]) [phone]  Maternity Homes: - [facility] [place] [facility] [phone] - [facility] [phone]  MRSA Hotline #:   (508)367-7619  [facility] Resources  [facility]  [employer] [facility] Dept. [address]                 [address]         [address] [place] [address]  Blondell Reveal Phone:  [phone]                                  Phone:  [phone]                   Phone:  [phone]  [facility], [phone] - [phone]       -     [facility] in [place], [address],                                  [phone], [facility] Child Abuse Hotline (812)289-2172 or [phone] (After Hours)   Behavioral Health Services  Substance Abuse Resources: - [facility] Drug Services  [phone] - [facility] [phone] - [facility] [place] [phone] Floydene Flock [phone] - Residential & Outpatient Substance Abuse Program  [phone]  Psychological Services: [facility]  [phone] Services  [phone] - [facility], [phone] [place]. [address], [place], ACCESS LINE: 9311756726 or [phone], [employer]an.co.za  Dental Assistance  If unable to pay or uninsured, contact:  [facility] or [facility]. to become qualified for the adult dental  clinic.  Patients with Medicaid: [facility] [phone] [address] Qu[address], [phone] [address] [phone]  If unable  to pay, or uninsured, contact [facility] [phone])[phone]) or [facility] Department [phone] in [place], [phone] in [facility]) to become qualified for the adult dental clinic  Other Low-Cost Community Dental Services: - [facility]- [address] [place], [place], [place], [address], [phone], Ext. [phone], 2nd and 4th Thursday of the month at 6:30am.  10 clients each day by appointment, can sometimes see walk-in patients if someone does not show for an appointment. [facility]- [address] [place], [place], [address], [phone] - [facility]- [address] [place], [place], [address], [phone] [facility] Health Department- [phone] [facility] Health Department- [phone] [facility] [phone]     Take the prescriptions as directed.  Apply moist heat or ice to the area(s) of discomfort, for 15 minutes at a time, several times per day for the next few days.  Do not fall asleep on a heating or ice pack.  Call your regular medical doctor tomorrow to schedule a follow up appointment this week.  Return to the Emergency Department immediately if worsening.

## 2011-11-10 NOTE — ED Provider Notes (Signed)
Medical screening examination/treatment/procedure(s) were performed by non-physician practitioner and as supervising physician I was immediately available for consultation/collaboration.  Geoffery Lyons, MD 11/10/11 [phone]

## 2011-12-02 ENCOUNTER — Encounter (HOSPITAL_COMMUNITY): Payer: Self-pay | Admitting: Emergency Medicine

## 2011-12-02 DIAGNOSIS — S40019A Contusion of unspecified shoulder, initial encounter: Secondary | ICD-10-CM | POA: Insufficient documentation

## 2011-12-02 DIAGNOSIS — X58XXXA Exposure to other specified factors, initial encounter: Secondary | ICD-10-CM | POA: Insufficient documentation

## 2011-12-02 DIAGNOSIS — Z888 Allergy status to other drugs, medicaments and biological substances status: Secondary | ICD-10-CM | POA: Insufficient documentation

## 2011-12-02 DIAGNOSIS — F17200 Nicotine dependence, unspecified, uncomplicated: Secondary | ICD-10-CM | POA: Insufficient documentation

## 2011-12-02 NOTE — ED Notes (Signed)
PT. REPORTS PAIN / INJURY TO LEFT SHOULDER WHILE LIFTING A BED OF TRUCK THIS EVENING.

## 2011-12-03 ENCOUNTER — Emergency Department (HOSPITAL_COMMUNITY)
Admit: 2011-12-03 | Discharge: 2011-12-03 | Disposition: A | Discharge status: Home / Self Care | Payer: Self-pay | Attending: Emergency Medicine | Admitting: Emergency Medicine

## 2011-12-03 ENCOUNTER — Emergency Department (HOSPITAL_COMMUNITY)
Admission: EM | Admit: 2011-12-03 | Discharge: 2011-12-03 | Disposition: A | Discharge status: Home / Self Care | Payer: Self-pay | Attending: Emergency Medicine | Admitting: Emergency Medicine

## 2011-12-03 DIAGNOSIS — S40019A Contusion of unspecified shoulder, initial encounter: Secondary | ICD-10-CM

## 2011-12-03 DIAGNOSIS — Z0389 Encounter for observation for other suspected diseases and conditions ruled out: Secondary | ICD-10-CM | POA: Insufficient documentation

## 2011-12-03 MED ORDER — OXYCODONE-ACETAMINOPHEN 5-325 MG PO TABS: 2.0000 | ORAL_TABLET | ORAL | Status: DC | PRN

## 2011-12-03 MED ORDER — OXYCODONE-ACETAMINOPHEN 5-325 MG PO TABS
1.0000 | ORAL_TABLET | Freq: Once | ORAL | Status: AC
  Administered 2011-12-03: 1 via ORAL
  Filled 2011-12-03: qty 1

## 2011-12-03 NOTE — ED Notes (Signed)
Pt states understanding of discharge instructions 

## 2011-12-03 NOTE — ED Notes (Signed)
Sensation present, Cap refill less than 3 seconds distal to injury. Skin warm pink and dry to the touch

## 2011-12-03 NOTE — ED Provider Notes (Signed)
History     CSN: [id number]  Arrival date & time 12/02/11  2352   First MD Initiated Contact with Patient 12/03/11 0145      Chief Complaint  Patient presents with  . Shoulder Injury    (Consider location/radiation/quality/duration/timing/severity/associated sxs/prior treatment) Patient is a 21 y.o. male presenting with shoulder injury. The history is provided by the patient.  Shoulder Injury This is a new problem. The current episode started 3 to 5 hours ago. The problem occurs constantly. The problem has not changed since onset.The symptoms are aggravated by twisting. Nothing relieves the symptoms. He has tried nothing for the symptoms.    History reviewed. No pertinent past medical history.  History reviewed. No pertinent past surgical history.  No family history on file.  History  Substance Use Topics  . Smoking status: Current Everyday Smoker  . Smokeless tobacco: Not on file  . Alcohol Use: No      Review of Systems  Musculoskeletal: Positive for joint swelling.  All other systems reviewed and are negative.    Allergies  Chantix and Aspirin  Home Medications   Current Outpatient Rx  Name Route Sig Dispense Refill  . OXYCODONE-ACETAMINOPHEN 5-325 MG PO TABS Oral Take 2 tablets by mouth every 4 (four) hours as needed for pain. 15 tablet 0    BP 131/72  Pulse 83  Temp 97.9 F (36.6 C) (Oral)  Resp 18  SpO2 99%  Physical Exam  Constitutional: He is oriented to person, place, and time. He appears well-developed and well-nourished.  HENT:  Head: Normocephalic and atraumatic.  Eyes: Conjunctivae are normal. Pupils are equal, round, and reactive to light.  Neck: Normal range of motion. Neck supple.  Cardiovascular: Normal rate, regular rhythm, normal heart sounds and intact distal pulses.   Pulmonary/Chest: Effort normal and breath sounds normal.  Abdominal: Soft. Bowel sounds are normal.  Musculoskeletal: He exhibits tenderness.        Arms: Neurological: He is alert and oriented to person, place, and time.  Skin: Skin is warm and dry.  Psychiatric: He has a normal mood and affect. His behavior is normal. Judgment and thought content normal.    ED Course  Procedures (including critical care time)  Labs Reviewed - No data to display Dg Shoulder Left  12/03/2011  *RADIOLOGY REPORT*  Clinical Data: Pain and stiffness post trauma.  LEFT SHOULDER - 2+ VIEW  Comparison: 07/16/2009  Findings: Negative for fracture, dislocation, or other acute abnormality.  Normal alignment and mineralization. No significant degenerative change.  Regional soft tissues unremarkable.  IMPRESSION:  Negative  Original Report Authenticated By: Thora Lance III, M.D.     1. Shoulder contusion       MDM  No fracture noted.  NV intact.  Will analgesai,  Dc to fu,  Ret new/worsening sxs        Sumire Halbleib Lytle Michaels, MD 12/03/11 [phone]

## 2011-12-03 NOTE — ED Notes (Signed)
Xray reviewed, acuity changed.

## 2011-12-09 ENCOUNTER — Emergency Department (HOSPITAL_COMMUNITY): Payer: Self-pay

## 2011-12-09 ENCOUNTER — Emergency Department (HOSPITAL_COMMUNITY)
Admission: EM | Admit: 2011-12-09 | Discharge: 2011-12-09 | Disposition: A | Discharge status: Home / Self Care | Payer: Self-pay | Attending: Emergency Medicine | Admitting: Emergency Medicine

## 2011-12-09 ENCOUNTER — Encounter (HOSPITAL_COMMUNITY): Payer: Self-pay | Admitting: *Deleted

## 2011-12-09 DIAGNOSIS — M25519 Pain in unspecified shoulder: Secondary | ICD-10-CM | POA: Insufficient documentation

## 2011-12-09 NOTE — ED Notes (Signed)
Pt is here with left shoulder pain after loading scrap metal and went to pick up washing machine and felt pop in left shoulder.  Happened today

## 2011-12-09 NOTE — ED Notes (Signed)
Pt was called for reassesment and vital signs with no answer x1

## 2011-12-09 NOTE — ED Notes (Signed)
Called and no answer.

## 2011-12-11 ENCOUNTER — Emergency Department (HOSPITAL_COMMUNITY)
Admission: EM | Admit: 2011-12-11 | Discharge: 2011-12-11 | Disposition: A | Discharge status: Home / Self Care | Payer: Self-pay | Attending: Emergency Medicine | Admitting: Emergency Medicine

## 2011-12-11 ENCOUNTER — Encounter (HOSPITAL_COMMUNITY): Payer: Self-pay | Admitting: *Deleted

## 2011-12-11 DIAGNOSIS — M25519 Pain in unspecified shoulder: Secondary | ICD-10-CM | POA: Insufficient documentation

## 2011-12-11 DIAGNOSIS — S46912A Strain of unspecified muscle, fascia and tendon at shoulder and upper arm level, left arm, initial encounter: Secondary | ICD-10-CM

## 2011-12-11 MED ORDER — DEXAMETHASONE SODIUM PHOSPHATE 4 MG/ML IJ SOLN
8.0000 mg | Freq: Once | INTRAMUSCULAR | Status: AC
  Administered 2011-12-11: 8 mg via INTRAMUSCULAR
  Filled 2011-12-11: qty 2

## 2011-12-11 MED ORDER — METHOCARBAMOL 500 MG PO TABS
1000.0000 mg | ORAL_TABLET | Freq: Once | ORAL | Status: AC
  Administered 2011-12-11: 1000 mg via ORAL
  Filled 2011-12-11: qty 2

## 2011-12-11 MED ORDER — BACLOFEN 10 MG PO TABS: 10.0000 mg | ORAL_TABLET | Freq: Three times a day (TID) | ORAL | Status: AC

## 2011-12-11 MED ORDER — DEXAMETHASONE 4 MG PO TABS: ORAL_TABLET | ORAL | Status: AC

## 2011-12-11 NOTE — ED Notes (Signed)
H. Bryant, PA at bedside. 

## 2011-12-11 NOTE — ED Notes (Signed)
Pain lt shoulder, onset Wednesday, when lifting washing machine. Had x-ray done at [facility], and left before treated.

## 2011-12-11 NOTE — ED Provider Notes (Signed)
History     CSN: [id number]  Arrival date & time 12/11/11  1511   First MD Initiated Contact with Patient 12/11/11 1551      Chief Complaint  Patient presents with  . Shoulder Pain    (Consider location/radiation/quality/duration/timing/severity/associated sxs/prior treatment) Patient is a 21 y.o. male presenting with shoulder pain. The history is provided by the patient.  Shoulder Pain This is a recurrent problem. The current episode started in the past 7 days. The problem occurs daily. The problem has been gradually worsening. Associated symptoms include arthralgias. Pertinent negatives include no abdominal pain, chest pain, coughing or neck pain. Exacerbated by: using the left shoulder. He has tried nothing for the symptoms. The treatment provided no relief.    History reviewed. No pertinent past medical history.  History reviewed. No pertinent past surgical history.  History reviewed. No pertinent family history.  History  Substance Use Topics  . Smoking status: Current Everyday Smoker  . Smokeless tobacco: Not on file  . Alcohol Use: No      Review of Systems  Constitutional: Negative for activity change.       All ROS Neg except as noted in HPI  HENT: Negative for nosebleeds and neck pain.   Eyes: Negative for photophobia and discharge.  Respiratory: Negative for cough, shortness of breath and wheezing.   Cardiovascular: Negative for chest pain and palpitations.  Gastrointestinal: Negative for abdominal pain and blood in stool.  Genitourinary: Negative for dysuria, frequency and hematuria.  Musculoskeletal: Positive for arthralgias. Negative for back pain.  Skin: Negative.   Neurological: Negative for dizziness, seizures and speech difficulty.  Psychiatric/Behavioral: Negative for hallucinations and confusion.    Allergies  Chantix and Aspirin  Home Medications  No current outpatient prescriptions on file.  BP 135/74  Pulse 96  Temp 98.2 F (36.8 C)  (Oral)  Resp 18  Ht 6' (1.829 m)  Wt 280 lb (127.007 kg)  BMI 37.97 kg/m2  SpO2 99%  Physical Exam  Nursing note and vitals reviewed. Constitutional: He is oriented to person, place, and time. He appears well-developed and well-nourished.  Non-toxic appearance.  HENT:  Head: Normocephalic.  Right Ear: Tympanic membrane and external ear normal.  Left Ear: Tympanic membrane and external ear normal.  Eyes: EOM and lids are normal. Pupils are equal, round, and reactive to light.  Neck: Normal range of motion. Neck supple. Carotid bruit is not present.  Cardiovascular: Normal rate, regular rhythm, normal heart sounds, intact distal pulses and normal pulses.   Pulmonary/Chest: Breath sounds normal. No respiratory distress.  Abdominal: Soft. Bowel sounds are normal. There is no tenderness. There is no guarding.  Musculoskeletal: Normal range of motion.       FROM of the fingers,wrist, and elbow of the left hand. The left elbow is warm, but not hot. No dislocation.  Pain with attempted ROM. No effusion palpated. Distal pulse and sensory wnl.  Lymphadenopathy:       Head (right side): No submandibular adenopathy present.       Head (left side): No submandibular adenopathy present.    He has no cervical adenopathy.  Neurological: He is alert and oriented to person, place, and time. He has normal strength. No cranial nerve deficit or sensory deficit.  Skin: Skin is warm and dry.  Psychiatric: He has a normal mood and affect. His speech is normal.    ED Course  Procedures (including critical care time)  Labs Reviewed - No data to display No results found.  No diagnosis found.    MDM  I have reviewed nursing notes, vital signs, and all appropriate lab and imaging results for this patient. Pt injured the left shoulder lifting a washing machine. Xray at [facility] negative for fracture or dislocation. Suspect shoulder strain. Rx for dexamethasone and robaxin given to the patient. Pt  fitted with sling. Pt to see Dr Romeo Apple for recheck if not improving.       [name], [place] 12/11/11 1610

## 2011-12-13 NOTE — ED Provider Notes (Signed)
Medical screening examination/treatment/procedure(s) were performed by non-physician practitioner and as supervising physician I was immediately available for consultation/collaboration.   Shelda Jakes, MD 12/13/11 1310

## 2011-12-25 ENCOUNTER — Emergency Department (HOSPITAL_COMMUNITY)
Admission: EM | Admit: 2011-12-25 | Discharge: 2011-12-26 | Disposition: A | Discharge status: Home / Self Care | Payer: Self-pay | Attending: Emergency Medicine | Admitting: Emergency Medicine

## 2011-12-25 ENCOUNTER — Encounter (HOSPITAL_COMMUNITY): Payer: Self-pay | Admitting: *Deleted

## 2011-12-25 DIAGNOSIS — Y9289 Other specified places as the place of occurrence of the external cause: Secondary | ICD-10-CM | POA: Insufficient documentation

## 2011-12-25 DIAGNOSIS — Y990 Civilian activity done for income or pay: Secondary | ICD-10-CM | POA: Insufficient documentation

## 2011-12-25 DIAGNOSIS — X58XXXA Exposure to other specified factors, initial encounter: Secondary | ICD-10-CM | POA: Insufficient documentation

## 2011-12-25 DIAGNOSIS — F17200 Nicotine dependence, unspecified, uncomplicated: Secondary | ICD-10-CM | POA: Insufficient documentation

## 2011-12-25 DIAGNOSIS — S00219A Abrasion of unspecified eyelid and periocular area, initial encounter: Secondary | ICD-10-CM

## 2011-12-25 DIAGNOSIS — IMO0002 Reserved for concepts with insufficient information to code with codable children: Secondary | ICD-10-CM | POA: Insufficient documentation

## 2011-12-25 MED ORDER — HYDROCODONE-ACETAMINOPHEN 5-325 MG PO TABS
ORAL_TABLET | ORAL | Status: AC
Start: 2011-12-25 — End: 2012-01-04

## 2011-12-25 MED ORDER — FLUORESCEIN SODIUM 1 MG OP STRP
ORAL_STRIP | OPHTHALMIC | Status: AC
  Administered 2011-12-25: 1
  Filled 2011-12-25: qty 1

## 2011-12-25 MED ORDER — TOBRAMYCIN 0.3 % OP SOLN
1.0000 [drp] | Freq: Once | OPHTHALMIC | Status: AC
  Administered 2011-12-26: 1 [drp] via OPHTHALMIC
  Filled 2011-12-25: qty 5

## 2011-12-25 MED ORDER — TETRACAINE HCL 0.5 % OP SOLN
2.0000 [drp] | Freq: Once | OPHTHALMIC | Status: AC
  Administered 2011-12-25: 2 [drp] via OPHTHALMIC
  Filled 2011-12-25: qty 2

## 2011-12-25 NOTE — ED Notes (Signed)
Pt c/o left eye pain and irritation x 2 days

## 2011-12-25 NOTE — ED Notes (Signed)
Woods lamp, fluorescein strips set up at bedside.

## 2011-12-25 NOTE — ED Provider Notes (Signed)
History     CSN: [id number]  Arrival date & time 12/25/11  2119   First MD Initiated Contact with Patient 12/25/11 2249      Chief Complaint  Patient presents with  . Eye Pain    (Consider location/radiation/quality/duration/timing/severity/associated sxs/prior treatment) HPI Comments: Patient c/o pain to his left eye for 2 days.  Pain began after working and thinks he may have gotten something in his eye.  C/o pain with bright light and blinking.  Also c/o blurred vision to the left eye.  He denies fever, neck pain, sore throat or dizziness  Patient is a 21 y.o. male presenting with eye injury. The history is provided by the patient.  Eye Injury This is a new problem. The current episode started in the past 7 days. The problem occurs constantly. The problem has been unchanged. Associated symptoms include a visual change. Pertinent negatives include no chest pain, coughing, fever, headaches, nausea, neck pain, numbness, sore throat, vertigo, vomiting or weakness. Exacerbated by: bright light and blinking. He has tried nothing for the symptoms. The treatment provided no relief.    History reviewed. No pertinent past medical history.  History reviewed. No pertinent past surgical history.  History reviewed. No pertinent family history.  History  Substance Use Topics  . Smoking status: Current Everyday Smoker  . Smokeless tobacco: Not on file  . Alcohol Use: No      Review of Systems  Constitutional: Negative for fever and appetite change.  HENT: Negative for ear pain, sore throat, facial swelling, neck pain and neck stiffness.   Eyes: Positive for photophobia, pain, redness and visual disturbance. Negative for discharge and itching.  Respiratory: Negative for cough.   Cardiovascular: Negative for chest pain.  Gastrointestinal: Negative for nausea and vomiting.  Neurological: Negative for dizziness, vertigo, facial asymmetry, speech difficulty, weakness, numbness and headaches.   All other systems reviewed and are negative.    Allergies  Aspirin  Home Medications   Current Outpatient Rx  Name Route Sig Dispense Refill  . BACLOFEN 10 MG PO TABS Oral Take 1 tablet (10 mg total) by mouth 3 (three) times daily. 21 each 0    BP 125/68  Pulse 88  Temp 98.2 F (36.8 C) (Oral)  Resp 18  Ht 6' (1.829 m)  Wt 270 lb (122.471 kg)  BMI 36.62 kg/m2  SpO2 100%  Physical Exam  Nursing note and vitals reviewed. Constitutional: He is oriented to person, place, and time. He appears well-developed and well-nourished. No distress.  HENT:  Head: Normocephalic and atraumatic.  Mouth/Throat: Oropharynx is clear and moist.  Eyes: EOM are normal. Pupils are equal, round, and reactive to light. No foreign bodies found. Right eye exhibits no discharge and no exudate. No foreign body present in the right eye. Left eye exhibits no chemosis, no discharge and no exudate. No foreign body present in the left eye. Left conjunctiva is injected. Right pupil is reactive. Left pupil is reactive. Pupils are equal.  Fundoscopic exam:      The right eye shows no exudate, no hemorrhage and no papilledema.       The left eye shows no exudate and no hemorrhage.  Slit lamp exam:      The right eye shows fluorescein uptake. The right eye shows no corneal flare, no corneal ulcer, no foreign body and no hyphema.       The left eye shows no fluorescein uptake.       Abrasion underneath the right upper  eyelid.  NO FB's  Neck: Normal range of motion. Neck supple.  Cardiovascular: Normal rate, regular rhythm, normal heart sounds and intact distal pulses.   No murmur heard. Pulmonary/Chest: Effort normal and breath sounds normal.  Musculoskeletal: Normal range of motion.  Lymphadenopathy:    He has no cervical adenopathy.  Neurological: He is alert and oriented to person, place, and time. He exhibits normal muscle tone. Coordination normal.  Skin: Skin is warm and dry.    ED Course  Procedures  (including critical care time)  Labs Reviewed - No data to display     MDM    Visual acuity reviewed by me.  Abrasion to the right upper eyelid.  No FB's seen on exam.  Pt agrees to close f/u with Dr. Lita Mains if sx's are not improving  Dispensed tobramycin for home use.  Prescribed #8 norco  The patient appears reasonably screened and/or stabilized for discharge and I doubt any other medical condition or other [facility] requiring further screening, evaluation, or treatment in the ED at this time prior to discharge.     Karalyn Kadel L. Cosme Jacob, [place] 12/28/11 1714

## 2011-12-31 NOTE — ED Provider Notes (Signed)
Medical screening examination/treatment/procedure(s) were performed by non-physician practitioner and as supervising physician I was immediately available for consultation/collaboration.   Casimiro Lienhard L Mahin Guardia, MD 12/31/11 1026 

## 2012-02-06 ENCOUNTER — Encounter (HOSPITAL_COMMUNITY): Payer: Self-pay | Admitting: Emergency Medicine

## 2012-02-06 ENCOUNTER — Emergency Department (HOSPITAL_COMMUNITY)
Admission: EM | Admit: 2012-02-06 | Discharge: 2012-02-06 | Disposition: A | Discharge status: Home / Self Care | Payer: Self-pay | Attending: Emergency Medicine | Admitting: Emergency Medicine

## 2012-02-06 DIAGNOSIS — M546 Pain in thoracic spine: Secondary | ICD-10-CM | POA: Insufficient documentation

## 2012-02-06 DIAGNOSIS — M79609 Pain in unspecified limb: Secondary | ICD-10-CM | POA: Insufficient documentation

## 2012-02-06 DIAGNOSIS — T148XXA Other injury of unspecified body region, initial encounter: Secondary | ICD-10-CM

## 2012-02-06 DIAGNOSIS — X500XXA Overexertion from strenuous movement or load, initial encounter: Secondary | ICD-10-CM | POA: Insufficient documentation

## 2012-02-06 DIAGNOSIS — F17200 Nicotine dependence, unspecified, uncomplicated: Secondary | ICD-10-CM | POA: Insufficient documentation

## 2012-02-06 MED ORDER — IBUPROFEN 800 MG PO TABS
800.0000 mg | ORAL_TABLET | Freq: Once | ORAL | Status: AC
  Administered 2012-02-06: 800 mg via ORAL
  Filled 2012-02-06: qty 1

## 2012-02-06 MED ORDER — CYCLOBENZAPRINE HCL 10 MG PO TABS: 10.0000 mg | ORAL_TABLET | Freq: Two times a day (BID) | ORAL | Status: DC | PRN

## 2012-02-06 MED ORDER — HYDROCODONE-ACETAMINOPHEN 5-325 MG PO TABS: 1.0000 | ORAL_TABLET | Freq: Four times a day (QID) | ORAL | Status: AC | PRN

## 2012-02-06 MED ORDER — HYDROCODONE-ACETAMINOPHEN 5-325 MG PO TABS
1.0000 | ORAL_TABLET | Freq: Once | ORAL | Status: AC
  Administered 2012-02-06: 1 via ORAL
  Filled 2012-02-06: qty 1

## 2012-02-06 MED ORDER — CYCLOBENZAPRINE HCL 10 MG PO TABS
10.0000 mg | ORAL_TABLET | Freq: Once | ORAL | Status: AC
  Administered 2012-02-06: 10 mg via ORAL
  Filled 2012-02-06: qty 1

## 2012-02-06 NOTE — ED Notes (Signed)
Patient reports was carrying and moving wood stove and states that he felt as if back gave out. Complaining of pain to right upper back and left arm.
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
## 2012-02-07 NOTE — ED Provider Notes (Signed)
Medical screening examination/treatment/procedure(s) were performed by non-physician practitioner and as supervising physician I was immediately available for consultation/collaboration. Devoria Albe, MD, FACEP   Ward Givens, MD 02/07/12 0100

## 2012-02-08 ENCOUNTER — Emergency Department (HOSPITAL_COMMUNITY)
Admission: EM | Admit: 2012-02-08 | Discharge: 2012-02-08 | Disposition: A | Discharge status: Home / Self Care | Payer: No Typology Code available for payment source | Attending: Emergency Medicine | Admitting: Emergency Medicine

## 2012-02-08 ENCOUNTER — Encounter (HOSPITAL_COMMUNITY): Payer: Self-pay | Admitting: *Deleted

## 2012-02-08 DIAGNOSIS — Y9381 Activity, refereeing a sports activity: Secondary | ICD-10-CM | POA: Insufficient documentation

## 2012-02-08 DIAGNOSIS — IMO0002 Reserved for concepts with insufficient information to code with codable children: Secondary | ICD-10-CM

## 2012-02-08 DIAGNOSIS — F17200 Nicotine dependence, unspecified, uncomplicated: Secondary | ICD-10-CM | POA: Insufficient documentation

## 2012-02-08 DIAGNOSIS — S139XXA Sprain of joints and ligaments of unspecified parts of neck, initial encounter: Secondary | ICD-10-CM | POA: Insufficient documentation

## 2012-02-08 MED ORDER — IBUPROFEN 400 MG PO TABS: 400.0000 mg | ORAL_TABLET | Freq: Four times a day (QID) | ORAL | Status: DC | PRN

## 2012-02-08 MED ORDER — KETOROLAC TROMETHAMINE 30 MG/ML IJ SOLN
30.0000 mg | Freq: Once | INTRAMUSCULAR | Status: AC
  Administered 2012-02-08: 30 mg via INTRAVENOUS
  Filled 2012-02-08 (×2): qty 1

## 2012-02-08 MED ORDER — METHOCARBAMOL 500 MG PO TABS: 500.0000 mg | ORAL_TABLET | Freq: Three times a day (TID) | ORAL | Status: DC
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
## 2012-02-22 ENCOUNTER — Encounter (HOSPITAL_COMMUNITY): Payer: Self-pay | Admitting: Emergency Medicine

## 2012-02-22 ENCOUNTER — Emergency Department (HOSPITAL_COMMUNITY)
Admission: EM | Admit: 2012-02-22 | Discharge: 2012-02-22 | Disposition: A | Discharge status: Home / Self Care | Payer: Self-pay | Attending: Emergency Medicine | Admitting: Emergency Medicine

## 2012-02-22 ENCOUNTER — Emergency Department (HOSPITAL_COMMUNITY): Payer: Self-pay

## 2012-02-22 DIAGNOSIS — S60229A Contusion of unspecified hand, initial encounter: Secondary | ICD-10-CM | POA: Insufficient documentation

## 2012-02-22 DIAGNOSIS — S5012XA Contusion of left forearm, initial encounter: Secondary | ICD-10-CM

## 2012-02-22 DIAGNOSIS — S60222A Contusion of left hand, initial encounter: Secondary | ICD-10-CM

## 2012-02-22 DIAGNOSIS — F17200 Nicotine dependence, unspecified, uncomplicated: Secondary | ICD-10-CM | POA: Insufficient documentation

## 2012-02-22 DIAGNOSIS — S5010XA Contusion of unspecified forearm, initial encounter: Secondary | ICD-10-CM | POA: Insufficient documentation

## 2012-02-22 MED ORDER — HYDROCODONE-ACETAMINOPHEN 5-325 MG PO TABS: 1.0000 | ORAL_TABLET | Freq: Four times a day (QID) | ORAL | Status: AC | PRN

## 2012-02-22 MED ORDER — HYDROCODONE-ACETAMINOPHEN 5-325 MG PO TABS
1.0000 | ORAL_TABLET | Freq: Once | ORAL | Status: AC
  Administered 2012-02-22: 1 via ORAL
  Filled 2012-02-22: qty 1

## 2012-02-22 MED ORDER — IBUPROFEN 800 MG PO TABS
800.0000 mg | ORAL_TABLET | Freq: Once | ORAL | Status: AC
  Administered 2012-02-22: 800 mg via ORAL
  Filled 2012-02-22: qty 1
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
## 2012-02-23 NOTE — ED Provider Notes (Signed)
Medical screening examination/treatment/procedure(s) were performed by non-physician practitioner and as supervising physician I was immediately available for consultation/collaboration.   Monick Rena W. Kinsey Cowsert, MD 02/23/12 1533 

## 2012-05-27 ENCOUNTER — Encounter (HOSPITAL_COMMUNITY): Payer: Self-pay | Admitting: *Deleted

## 2012-05-27 ENCOUNTER — Emergency Department (HOSPITAL_COMMUNITY)
Admission: EM | Admit: 2012-05-27 | Discharge: 2012-05-27 | Disposition: A | Discharge status: Home / Self Care | Payer: Self-pay | Attending: Emergency Medicine | Admitting: Emergency Medicine

## 2012-05-27 DIAGNOSIS — F17200 Nicotine dependence, unspecified, uncomplicated: Secondary | ICD-10-CM | POA: Insufficient documentation

## 2012-05-27 DIAGNOSIS — M545 Low back pain, unspecified: Secondary | ICD-10-CM | POA: Insufficient documentation

## 2012-05-27 DIAGNOSIS — M549 Dorsalgia, unspecified: Secondary | ICD-10-CM

## 2012-05-27 MED ORDER — KETOROLAC TROMETHAMINE 60 MG/2ML IM SOLN
60.0000 mg | Freq: Once | INTRAMUSCULAR | Status: AC
  Administered 2012-05-27: 60 mg via INTRAMUSCULAR
  Filled 2012-05-27: qty 2

## 2012-05-27 MED ORDER — HYDROCODONE-ACETAMINOPHEN 5-325 MG PO TABS: 2.0000 | ORAL_TABLET | ORAL | Status: AC | PRN

## 2012-05-27 MED ORDER — IBUPROFEN 800 MG PO TABS: 800.0000 mg | ORAL_TABLET | Freq: Three times a day (TID) | ORAL | Status: DC

## 2012-05-27 MED ORDER — CYCLOBENZAPRINE HCL 10 MG PO TABS: 10.0000 mg | ORAL_TABLET | Freq: Two times a day (BID) | ORAL | Status: DC | PRN

## 2012-05-27 NOTE — ED Provider Notes (Signed)
History     CSN: [id number]  Arrival date & time 05/27/12  3244   First MD Initiated Contact with Patient 05/27/12 1955      Chief Complaint  Patient presents with  . Back Pain    (Consider location/radiation/quality/duration/timing/severity/associated sxs/prior treatment) Patient is a 22 y.o. male presenting with back pain. The history is provided by the patient. No language interpreter was used.  Back Pain  This is a new problem. The current episode started yesterday. The problem occurs constantly. The problem has been gradually worsening. The pain is associated with no known injury. The pain is present in the lumbar spine. The quality of the pain is described as aching. The pain does not radiate. The pain is at a severity of 6/10. The pain is moderate. The symptoms are aggravated by bending. He has tried nothing for the symptoms. The treatment provided moderate relief.    History reviewed. No pertinent past medical history.  History reviewed. No pertinent past surgical history.  History reviewed. No pertinent family history.  History  Substance Use Topics  . Smoking status: Current Every Day Smoker  . Smokeless tobacco: Not on file  . Alcohol Use: No      Review of Systems  Musculoskeletal: Positive for back pain.  All other systems reviewed and are negative.    Allergies  Aspirin  Home Medications  No current outpatient prescriptions on file.  BP 143/78  Pulse 93  Temp 98.4 F (36.9 C) (Oral)  Resp 20  Ht 6' (1.829 m)  Wt 247 lb (112.038 kg)  BMI 33.50 kg/m2  SpO2 100%  Physical Exam  Nursing note and vitals reviewed. Constitutional: He is oriented to person, place, and time. He appears well-developed and well-nourished.  HENT:  Head: Normocephalic.  Right Ear: External ear normal.  Left Ear: External ear normal.  Eyes: Conjunctivae normal and EOM are normal. Pupils are equal, round, and reactive to light.  Neck: Normal range of motion. Neck supple.   Cardiovascular: Normal rate.   Pulmonary/Chest: Effort normal.  Abdominal: Soft.  Musculoskeletal: He exhibits tenderness.       ls spine tender to palpation,  Ns and nv intact  Neurological: He is alert and oriented to person, place, and time. He has normal reflexes.  Skin: Skin is warm.  Psychiatric: He has a normal mood and affect.    ED Course  Procedures (including critical care time)  Labs Reviewed - No data to display No results found.   No diagnosis found.    MDM  Flexeril, ibuprofen and hydrocodone.   Pt advised to follow up with his MD for recheck        Lonia Skinner [place], [place] 05/27/12 2013

## 2012-05-27 NOTE — ED Notes (Signed)
Low back pain, after lifting a wood stove.

## 2012-05-27 NOTE — ED Provider Notes (Signed)
Medical screening examination/treatment/procedure(s) were performed by non-physician practitioner and as supervising physician I was immediately available for consultation/collaboration.   Alexxis Mackert L Alyzza Andringa, MD 05/27/12 2311 

## 2012-06-10 ENCOUNTER — Encounter (HOSPITAL_COMMUNITY): Payer: Self-pay | Admitting: Cardiology

## 2012-06-10 ENCOUNTER — Emergency Department (HOSPITAL_COMMUNITY)
Admission: EM | Admit: 2012-06-10 | Discharge: 2012-06-10 | Discharge status: Against Medical Advice | Payer: Self-pay | Attending: Emergency Medicine | Admitting: Emergency Medicine

## 2012-06-10 DIAGNOSIS — M549 Dorsalgia, unspecified: Secondary | ICD-10-CM | POA: Insufficient documentation

## 2012-06-10 DIAGNOSIS — R300 Dysuria: Secondary | ICD-10-CM | POA: Insufficient documentation

## 2012-06-10 DIAGNOSIS — F17200 Nicotine dependence, unspecified, uncomplicated: Secondary | ICD-10-CM | POA: Insufficient documentation

## 2012-06-10 DIAGNOSIS — N342 Other urethritis: Secondary | ICD-10-CM | POA: Insufficient documentation

## 2012-06-10 LAB — URINALYSIS, ROUTINE W REFLEX MICROSCOPIC
Bilirubin Urine: NEGATIVE
Nitrite: NEGATIVE
Specific Gravity, Urine: 1.024 (ref 1.005–1.030)
Urobilinogen, UA: 1 mg/dL (ref 0.0–1.0)
pH: 6 (ref 5.0–8.0)

## 2012-06-10 LAB — URINE MICROSCOPIC-ADD ON

## 2012-06-10 MED ORDER — CEFTRIAXONE SODIUM 250 MG IJ SOLR: 250.0000 mg | Freq: Once | INTRAMUSCULAR | Status: DC

## 2012-06-10 MED ORDER — ACETAMINOPHEN 325 MG PO TABS: 975.0000 mg | ORAL_TABLET | Freq: Once | ORAL | Status: DC

## 2012-06-10 MED ORDER — AZITHROMYCIN 250 MG PO TABS: 1000.0000 mg | ORAL_TABLET | Freq: Once | ORAL | Status: DC

## 2012-06-10 NOTE — ED Provider Notes (Addendum)
History     CSN: [id number]  Arrival date & time 06/10/12  1721   First MD Initiated Contact with Patient 06/10/12 1844      Chief Complaint  Patient presents with  . Flank Pain    (Consider location/radiation/quality/duration/timing/severity/associated sxs/prior treatment) HPI Complains of burning on urination at his penis for the past 3 days pain is worse with urination not improved by anything no treatment prior to coming here no other associated symptoms. Also complains of left lower back pain for the several days which is worse with lifting or changing positions no fever no nausea or vomiting no other complaint denies discharge from his penis. Admits to unprotected sex . History reviewed. No pertinent past medical history.  back pain  History reviewed. No pertinent past surgical history.  History reviewed. No pertinent family history.  History  Substance Use Topics  . Smoking status: Current Every Day Smoker  . Smokeless tobacco: Not on file  . Alcohol Use: Yes      Review of Systems  Constitutional: Negative.   HENT: Negative.   Respiratory: Negative.   Cardiovascular: Negative.   Gastrointestinal: Negative.   Genitourinary: Positive for dysuria.  Musculoskeletal: Positive for back pain.  Skin: Negative.   Neurological: Negative.   Psychiatric/Behavioral: Negative.   All other systems reviewed and are negative.    Allergies  Aspirin  Home Medications  No current outpatient prescriptions on file.  BP 141/77  Pulse 95  Temp(Src) 98.3 F (36.8 C) (Oral)  Resp 18  SpO2 98%  Physical Exam  Nursing note and vitals reviewed. Constitutional: He appears well-developed and well-nourished.  HENT:  Head: Normocephalic and atraumatic.  Eyes: Conjunctivae are normal. Pupils are equal, round, and reactive to light.  Neck: Neck supple. No tracheal deviation present. No thyromegaly present.  Cardiovascular: Normal rate and regular rhythm.   No murmur  heard. Pulmonary/Chest: Effort normal and breath sounds normal.  Abdominal: Soft. Bowel sounds are normal. He exhibits no distension. There is no tenderness.  Genitourinary: Penis normal.  No urethral discharge  Musculoskeletal: Normal range of motion. He exhibits no edema and no tenderness.  Neurological: He is alert. Coordination normal.  Skin: Skin is warm and dry. No rash noted.  Psychiatric: He has a normal mood and affect.    ED Course  Procedures (including critical care time)  Labs Reviewed  GC/CHLAMYDIA PROBE AMP  RPR  URINALYSIS, ROUTINE W REFLEX MICROSCOPIC   No results found.   No diagnosis found.  I ordered antibiotics the patient however he did not wait for treatment or instructions and left the emergency department without telling staff  MDM   Patient's symptoms consistent with urethritis. Low back pain consistent with musculoskektal pain but he's experienced in the past.       Doug Sou, MD 06/11/12 6295  Doug Sou, MD 06/11/12 2841

## 2012-06-10 NOTE — ED Notes (Signed)
Pt reports for the past 3 days he has been having pain with urination and and dark colored urine. Reports hx of kidney stones, with n/v. Also reports pain during intercourse but denies any discharge.

## 2012-06-10 NOTE — ED Notes (Signed)
According to Jeannett Senior, EMT, pt. Left room to step out and was to come back. Pt. Informed us around 2030. Pt. Was clothed. 28413. EDP made aware. 2105: Called for pt. And pt. No where to be found.

## 2012-06-10 NOTE — ED Notes (Signed)
Pt left stated he was going to return.

## 2012-06-11 LAB — RPR: RPR Ser Ql: NONREACTIVE

## 2012-06-12 LAB — GC/CHLAMYDIA PROBE AMP: GC Probe RNA: NEGATIVE
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
## 2019-07-13 ENCOUNTER — Emergency Department (HOSPITAL_COMMUNITY)
Admission: EM | Admit: 2019-07-13 | Discharge: 2019-07-13 | Disposition: A | Discharge status: Home / Self Care | Payer: Self-pay | Attending: Emergency Medicine | Admitting: Emergency Medicine

## 2019-07-13 ENCOUNTER — Encounter (HOSPITAL_COMMUNITY): Payer: Self-pay | Admitting: *Deleted

## 2019-07-13 ENCOUNTER — Other Ambulatory Visit: Payer: Self-pay

## 2019-07-13 DIAGNOSIS — Z87891 Personal history of nicotine dependence: Secondary | ICD-10-CM | POA: Insufficient documentation

## 2019-07-13 DIAGNOSIS — Y990 Civilian activity done for income or pay: Secondary | ICD-10-CM | POA: Insufficient documentation

## 2019-07-13 DIAGNOSIS — S39012A Strain of muscle, fascia and tendon of lower back, initial encounter: Secondary | ICD-10-CM | POA: Insufficient documentation

## 2019-07-13 DIAGNOSIS — R8290 Unspecified abnormal findings in urine: Secondary | ICD-10-CM | POA: Insufficient documentation

## 2019-07-13 DIAGNOSIS — N50819 Testicular pain, unspecified: Secondary | ICD-10-CM | POA: Insufficient documentation

## 2019-07-13 DIAGNOSIS — Y999 Unspecified external cause status: Secondary | ICD-10-CM | POA: Insufficient documentation

## 2019-07-13 DIAGNOSIS — Y9389 Activity, other specified: Secondary | ICD-10-CM | POA: Insufficient documentation

## 2019-07-13 DIAGNOSIS — X500XXA Overexertion from strenuous movement or load, initial encounter: Secondary | ICD-10-CM | POA: Insufficient documentation

## 2019-07-13 DIAGNOSIS — Y9289 Other specified places as the place of occurrence of the external cause: Secondary | ICD-10-CM | POA: Insufficient documentation

## 2019-07-13 LAB — URINALYSIS, ROUTINE W REFLEX MICROSCOPIC
Bilirubin Urine: NEGATIVE
Glucose, UA: NEGATIVE mg/dL
Hgb urine dipstick: NEGATIVE
Ketones, ur: NEGATIVE mg/dL
Leukocytes,Ua: NEGATIVE
Nitrite: NEGATIVE
Protein, ur: NEGATIVE mg/dL
Specific Gravity, Urine: 1.028 (ref 1.005–1.030)
pH: 5 (ref 5.0–8.0)

## 2019-07-13 MED ORDER — IBUPROFEN 800 MG PO TABS: 800.0000 mg | ORAL_TABLET | Freq: Three times a day (TID) | ORAL | 0 refills | Status: DC

## 2019-07-13 MED ORDER — CYCLOBENZAPRINE HCL 10 MG PO TABS: 10.0000 mg | ORAL_TABLET | Freq: Two times a day (BID) | ORAL | 0 refills | Status: DC | PRN

## 2019-07-13 MED ORDER — KETOROLAC TROMETHAMINE 60 MG/2ML IM SOLN
60.0000 mg | Freq: Once | INTRAMUSCULAR | Status: AC
  Administered 2019-07-13: 17:00:00 60 mg via INTRAMUSCULAR
  Filled 2019-07-13: qty 2

## 2019-07-13 NOTE — Discharge Instructions (Signed)
Please take medication as directed. You have a work note attached. You may return sooner if light duty option is available the limits lifting

## 2019-07-13 NOTE — ED Triage Notes (Signed)
Pt with lower back pain across that radiates down bilateral legs, urine darker in color per pt but denies any burning with urination.  Denies any incont. Of urine or bowels. Pt states does a lot of heavy lifting at work.

## 2019-07-13 NOTE — ED Provider Notes (Signed)
[facility] EMERGENCY DEPARTMENT Provider Note   CSN: [id number] Arrival date & time: 07/13/19  1607     History Chief Complaint  Patient presents with  . Back Pain    Samuel Wood is a 29 y.o. male.  Patient with low back pain since Tuesday. Pain radiating to bilateral lower back and into testicles. Patient reports frequent heavy lifting at work. He has tried ibuprofen and heating pad without improvement. States his urine has been "dark". No urinary symptoms. No fecal or urinary incontinence. Prior history of kidney stones.  The history is provided by the patient. No language interpreter was used.  Back Pain Location:  Lumbar spine Quality:  Shooting and stabbing Pain severity:  Severe Pain is:  Same all the time Onset quality:  Sudden Duration:  2 days Timing:  Constant Progression:  Unchanged Chronicity:  New Context: lifting heavy objects   Ineffective treatments:  Ibuprofen Associated symptoms: no abdominal pain, no bladder incontinence, no bowel incontinence, no dysuria, no fever, no perianal numbness and no weakness        History reviewed. No pertinent past medical history.  There are no problems to display for this patient.   History reviewed. No pertinent surgical history.     History reviewed. No pertinent family history.  Social History   Tobacco Use  . Smoking status: Former Games developer  . Smokeless tobacco: Never Used  Substance Use Topics  . Alcohol use: Not Currently  . Drug use: No    Home Medications Prior to Admission medications   Not on File    Allergies    Aspirin  Review of Systems   Review of Systems  Constitutional: Negative for fever.  Gastrointestinal: Negative for abdominal pain and bowel incontinence.  Genitourinary: Positive for testicular pain. Negative for bladder incontinence, dysuria, frequency and scrotal swelling.  Musculoskeletal: Positive for back pain.  Neurological: Negative for weakness.  All other systems  reviewed and are negative.   Physical Exam Updated Vital Signs BP 128/82 (BP Location: Right Arm)   Pulse 73   Temp 98.4 F (36.9 C) (Oral)   Resp 20   Ht 6' (1.829 m)   Wt 124.7 kg   SpO2 98%   BMI 37.30 kg/m   Physical Exam Vitals and nursing note reviewed.  HENT:     Head: Normocephalic.     Mouth/Throat:     Mouth: Mucous membranes are moist.  Eyes:     Conjunctiva/sclera: Conjunctivae normal.  Cardiovascular:     Rate and Rhythm: Normal rate.  Pulmonary:     Effort: Pulmonary effort is normal.  Abdominal:     Palpations: Abdomen is soft.     Hernia: There is no hernia in the left inguinal area or right inguinal area.  Genitourinary:    Penis: Normal.      Testes: Normal. Cremasteric reflex is present.     Epididymis:     Right: Normal.     Left: Normal.  Musculoskeletal:        General: Normal range of motion.  Lymphadenopathy:     Lower Body: No right inguinal adenopathy. No left inguinal adenopathy.  Skin:    General: Skin is warm and dry.  Neurological:     Mental Status: He is alert and oriented to person, place, and time.  Psychiatric:        Mood and Affect: Mood normal.     ED Results / Procedures / Treatments   Labs (all labs ordered are listed,  but only abnormal results are displayed) Labs Reviewed  URINALYSIS, ROUTINE W REFLEX MICROSCOPIC    EKG None  Radiology No results found.  Procedures Procedures (including critical care time)  Medications Ordered in ED Medications  ketorolac (TORADOL) injection 60 mg (has no administration in time range)    ED Course  I have reviewed the triage vital signs and the nursing notes.  Pertinent labs & imaging results that were available during my care of the patient were reviewed by me and considered in my medical decision making (see chart for details).  Pain improved after toradol.   MDM Rules/Calculators/A&P                      Patient with back pain.  No neurological deficits and  normal neuro exam.  Patient is ambulatory.  No loss of bowel or bladder control.  No concern for cauda equina.  No fever, night sweats, weight loss, h/o cancer, IVDA, no recent procedure to back. No urinary symptoms suggestive of UTI.  Supportive care and return precaution discussed. Rx for ibuprofen and flexeril. Work note provided. Appears safe for discharge at this time. Follow up as indicated in discharge paperwork.  Final Clinical Impression(s) / ED Diagnoses Final diagnoses:  Strain of lumbar region, initial encounter    Rx / DC Orders ED Discharge Orders         Ordered    cyclobenzaprine (FLEXERIL) 10 MG tablet  2 times daily PRN     07/13/19 1742    ibuprofen (ADVIL) 800 MG tablet  3 times daily     07/13/19 1742           Etta Quill, NP 07/13/19 1751    Davonna Belling, MD 07/13/19 [phone]

## 2020-04-20 HISTORY — PX: CYST REMOVAL NECK: SHX6281

## 2020-09-28 ENCOUNTER — Emergency Department (HOSPITAL_COMMUNITY)
Admission: EM | Admit: 2020-09-28 | Discharge: 2020-09-28 | Disposition: A | Discharge status: Home / Self Care | Payer: BC Managed Care – PPO | Attending: Emergency Medicine | Admitting: Emergency Medicine

## 2020-09-28 ENCOUNTER — Other Ambulatory Visit: Payer: Self-pay

## 2020-09-28 ENCOUNTER — Encounter (HOSPITAL_COMMUNITY): Payer: Self-pay | Admitting: *Deleted

## 2020-09-28 DIAGNOSIS — Z79899 Other long term (current) drug therapy: Secondary | ICD-10-CM | POA: Diagnosis not present

## 2020-09-28 DIAGNOSIS — Z7982 Long term (current) use of aspirin: Secondary | ICD-10-CM | POA: Diagnosis not present

## 2020-09-28 DIAGNOSIS — E119 Type 2 diabetes mellitus without complications: Secondary | ICD-10-CM | POA: Insufficient documentation

## 2020-09-28 DIAGNOSIS — F419 Anxiety disorder, unspecified: Secondary | ICD-10-CM | POA: Diagnosis not present

## 2020-09-28 DIAGNOSIS — R079 Chest pain, unspecified: Secondary | ICD-10-CM | POA: Insufficient documentation

## 2020-09-28 DIAGNOSIS — Z87891 Personal history of nicotine dependence: Secondary | ICD-10-CM | POA: Insufficient documentation

## 2020-09-28 HISTORY — DX: Depression, unspecified: F32.A

## 2020-09-28 HISTORY — DX: Type 2 diabetes mellitus without complications: E11.9

## 2020-09-28 LAB — CBC
HCT: 48.4 % (ref 39.0–52.0)
Hemoglobin: 15.4 g/dL (ref 13.0–17.0)
MCH: 27.8 pg (ref 26.0–34.0)
MCHC: 31.8 g/dL (ref 30.0–36.0)
MCV: 87.5 fL (ref 80.0–100.0)
Platelets: 246 10*3/uL (ref 150–400)
RBC: 5.53 MIL/uL (ref 4.22–5.81)
RDW: 13.4 % (ref 11.5–15.5)
WBC: 9.4 10*3/uL (ref 4.0–10.5)
nRBC: 0 % (ref 0.0–0.2)

## 2020-09-28 LAB — BASIC METABOLIC PANEL
Anion gap: 6 (ref 5–15)
BUN: 12 mg/dL (ref 6–20)
CO2: 24 mmol/L (ref 22–32)
Calcium: 8.8 mg/dL — ABNORMAL LOW (ref 8.9–10.3)
Chloride: 108 mmol/L (ref 98–111)
Creatinine, Ser: 0.78 mg/dL (ref 0.61–1.24)
GFR, Estimated: >60 mL/min (ref >60)
Glucose, Bld: 101 mg/dL — ABNORMAL HIGH (ref 70–99)
Potassium: 3.8 mmol/L (ref 3.5–5.1)
Sodium: 138 mmol/L (ref 135–145)

## 2020-09-28 LAB — TROPONIN I (HIGH SENSITIVITY): Troponin I (High Sensitivity): 2 ng/L (ref <18)

## 2020-09-28 NOTE — ED Provider Notes (Signed)
[facility] EMERGENCY DEPARTMENT Provider Note   CSN: [id number] Arrival date & time: 09/28/20  1818     History Chief Complaint  Patient presents with   Chest Pain    RAFI KENNETH is a 30 y.o. male.   Chest Pain    Patient is a 30 year old male, he has a history of recently diagnosed prediabetes or early diabetes as well as a history of depression for which she takes trazodone and citalopram.  The patient reports to me that he has been having chest pain for the last 2 months, this will occur at random times including after eating, when he is sleeping, when he is exerting himself and is not consistent such that it occurs about every other day.  He has been seen by his family doctor and referred to a cardiologist who has ordered testing which is to be done in the first week of August at the [facility].  The patient does not smoke or drink, he does have a family history of coronary disease such that his father recently passed, his uncle passed at the age of 2 and his grandfather passed in his late 41s.  They were both smokers and had diabetes.  The patient is not having any symptoms at this time.  He was seen earlier today at an outside hospital and had testing performed including blood work a chest x-ray and an EKG, he was told everything was normal and that he likely had anxiety.  The patient denies fevers, coughing, swelling of the legs and no nausea or vomiting.  He did have some diarrhea earlier in the day.  He is still concerned about his chest pain.   Past Medical History:  Diagnosis Date   Depression    Diabetes mellitus without complication (HCC)     There are no problems to display for this patient.   History reviewed. No pertinent surgical history.     History reviewed. No pertinent family history.  Social History   Tobacco Use   Smoking status: Former    Pack years: 0.00   Smokeless tobacco: Never  Substance Use Topics    Alcohol use: Not Currently   Drug use: No    Home Medications Prior to Admission medications   Medication Sig Start Date End Date Taking? Authorizing Provider  albuterol (VENTOLIN HFA) 108 (90 Base) MCG/ACT inhaler Inhale 2 puffs into the lungs every 4 (four) hours as needed for shortness of breath. 03/03/20 03/03/21 Yes [provider]  aspirin 81 MG EC tablet Take 81 mg by mouth daily.   Yes [provider]  Cholecalciferol (VITAMIN D3) 50 MCG (2000 UT) TABS Take 1 tablet by mouth daily.   Yes [provider]  citalopram (CELEXA) 20 MG tablet Take 20 mg by mouth daily. 09/11/20  Yes [provider]  traZODone (DESYREL) 100 MG tablet Take 100 mg by mouth at bedtime. 09/11/20  Yes [provider]  cyclobenzaprine (FLEXERIL) 10 MG tablet Take 1 tablet (10 mg total) by mouth 2 (two) times daily as needed for muscle spasms. Patient not taking: Reported on 09/28/2020 07/13/19   Felicie Morn, NP  ibuprofen (ADVIL) 800 MG tablet Take 1 tablet (800 mg total) by mouth 3 (three) times daily. Patient not taking: Reported on 09/28/2020 07/13/19   Felicie Morn, NP    Allergies    Aspirin  Review of Systems   Review of Systems  Cardiovascular:  Positive for chest pain.  All  other systems reviewed and are negative.  Physical Exam Updated Vital Signs BP 137/90   Pulse 76   Temp 98 F (36.7 C) (Oral)   Resp 19   Ht 1.829 m (6')   Wt 125 kg   SpO2 100%   BMI 37.37 kg/m   Physical Exam Vitals and nursing note reviewed.  Constitutional:      General: He is not in acute distress.    Appearance: He is well-developed.  HENT:     Head: Normocephalic and atraumatic.     Mouth/Throat:     Pharynx: No oropharyngeal exudate.  Eyes:     General: No scleral icterus.       Right eye: No discharge.        Left eye: No discharge.     Conjunctiva/sclera: Conjunctivae normal.     Pupils: Pupils are equal, round, and reactive to light.  Neck:     Thyroid:  No thyromegaly.     Vascular: No JVD.  Cardiovascular:     Rate and Rhythm: Normal rate and regular rhythm.     Heart sounds: Normal heart sounds. No murmur heard.   No friction rub. No gallop.  Pulmonary:     Effort: Pulmonary effort is normal. No respiratory distress.     Breath sounds: Normal breath sounds. No wheezing or rales.  Abdominal:     General: Bowel sounds are normal. There is no distension.     Palpations: Abdomen is soft. There is no mass.     Tenderness: There is no abdominal tenderness.  Musculoskeletal:        General: No tenderness. Normal range of motion.     Cervical back: Normal range of motion and neck supple.  Lymphadenopathy:     Cervical: No cervical adenopathy.  Skin:    General: Skin is warm and dry.     Findings: No erythema or rash.  Neurological:     Mental Status: He is alert.     Coordination: Coordination normal.  Psychiatric:        Mood and Affect: Mood is anxious.        Behavior: Behavior normal. Behavior is not agitated.    ED Results / Procedures / Treatments   Labs (all labs ordered are listed, but only abnormal results are displayed) Labs Reviewed  BASIC METABOLIC PANEL - Abnormal; Notable for the following components:      Result Value   Glucose, Bld 101 (*)    Calcium 8.8 (*)    All other components within normal limits  CBC  TROPONIN I (HIGH SENSITIVITY)    EKG EKG Interpretation  Date/Time:  Saturday September 28 2020 18:29:09 EDT Ventricular Rate:  69 PR Interval:  142 QRS Duration: 95 QT Interval:  382 QTC Calculation: 410 R Axis:   72 Text Interpretation: Sinus rhythm Normal ECG No old tracing to compare Confirmed by Eber Hong ([id number]) on 09/28/2020 6:42:38 PM  Radiology No results found.  Procedures Procedures   Medications Ordered in ED Medications - No data to display  ED Course  I have reviewed the triage vital signs and the nursing notes.  Pertinent labs & imaging results that were available during my  care of the patient were reviewed by me and considered in my medical decision making (see chart for details).    MDM Rules/Calculators/A&P                          This  patient's cardiac exam is unremarkable, there is no murmurs rubs or gallops.  The patient has no signs of edema, his heart rate is 69, and there is no ischemia on the EKG, there is no arrhythmias, no PVCs, no signs of accessory pathways.  Will repeat troponin, chest x-ray, I doubt that he has a pulmonary embolism given the intermittent nature of his symptoms without any risk factors for pulmonary embolism.  The patient is agreeable, I have given him reassurance that I would like to redo his labs and testing so that we have formal evaluation done that both he and I can trust.  He is agreeable.  The patient spouse is now here and states that he has had increasing anxiety and symptoms ever since his father died in 2023/04/13.  This in part correlates with the increasing numbers and frequency of his symptoms.  I have referred him to psychiatry, he has no acute findings on his work-up to suggest a cardiac or pathological source and his vital signs remained stable.  Both he and his wife are aware of the plan and stable for discharge  Final Clinical Impression(s) / ED Diagnoses Final diagnoses:  Anxiety  Chest pain at rest    Rx / DC Orders ED Discharge Orders     None        Eber Hong, MD 09/28/20 2038

## 2020-09-28 NOTE — Discharge Instructions (Signed)
Your testing has revealed no abnormalities.  Your blood work, EKG are all normal.  I would highly recommend that you follow-up for the further cardiac testing however at this time I would also recommend that you see a psychiatrist.  Please see the list below for psychiatry in [place].

## 2020-09-28 NOTE — ED Notes (Signed)
ED Provider at bedside. 

## 2020-09-28 NOTE — ED Notes (Signed)
EDP in room  

## 2020-09-28 NOTE — ED Triage Notes (Signed)
Pt with cp across the chest, seen at [facility] for same earlier today.  Pt also c/o dizziness and feelings of like going to pass out.

## 2020-10-08 ENCOUNTER — Other Ambulatory Visit: Payer: Self-pay | Admitting: Nurse Practitioner

## 2020-10-08 DIAGNOSIS — R7989 Other specified abnormal findings of blood chemistry: Secondary | ICD-10-CM

## 2020-10-08 DIAGNOSIS — B182 Chronic viral hepatitis C: Secondary | ICD-10-CM

## 2020-11-01 ENCOUNTER — Ambulatory Visit
Admission: RE | Admit: 2020-11-01 | Discharge: 2020-11-01 | Disposition: A | Discharge status: Home / Self Care | Payer: BC Managed Care – PPO | Source: Ambulatory Visit | Attending: Nurse Practitioner | Admitting: Nurse Practitioner

## 2020-11-01 ENCOUNTER — Encounter: Payer: Self-pay | Admitting: Internal Medicine

## 2020-11-01 DIAGNOSIS — R7989 Other specified abnormal findings of blood chemistry: Secondary | ICD-10-CM

## 2020-11-01 DIAGNOSIS — B182 Chronic viral hepatitis C: Secondary | ICD-10-CM

## 2021-04-30 ENCOUNTER — Other Ambulatory Visit: Payer: Self-pay | Admitting: Nurse Practitioner

## 2021-04-30 DIAGNOSIS — K7402 Hepatic fibrosis, advanced fibrosis: Secondary | ICD-10-CM

## 2021-05-14 ENCOUNTER — Ambulatory Visit
Admission: RE | Admit: 2021-05-14 | Discharge: 2021-05-14 | Disposition: A | Discharge status: Home / Self Care | Payer: Medicaid Other | Source: Ambulatory Visit | Attending: Nurse Practitioner | Admitting: Nurse Practitioner

## 2021-05-14 DIAGNOSIS — K7402 Hepatic fibrosis, advanced fibrosis: Secondary | ICD-10-CM

## 2021-09-19 ENCOUNTER — Encounter (HOSPITAL_BASED_OUTPATIENT_CLINIC_OR_DEPARTMENT_OTHER): Payer: Self-pay

## 2021-10-14 ENCOUNTER — Encounter: Payer: Medicaid Other | Admitting: Neurology

## 2021-10-29 ENCOUNTER — Other Ambulatory Visit: Payer: Self-pay | Admitting: Nurse Practitioner

## 2021-10-29 DIAGNOSIS — K7402 Hepatic fibrosis, advanced fibrosis: Secondary | ICD-10-CM

## 2021-10-29 DIAGNOSIS — K760 Fatty (change of) liver, not elsewhere classified: Secondary | ICD-10-CM

## 2021-11-03 ENCOUNTER — Inpatient Hospital Stay: Admission: RE | Admit: 2021-11-03 | Payer: Medicaid Other | Source: Ambulatory Visit

## 2021-12-02 ENCOUNTER — Other Ambulatory Visit: Payer: Self-pay

## 2022-01-05 ENCOUNTER — Other Ambulatory Visit: Payer: Self-pay

## 2022-01-26 ENCOUNTER — Inpatient Hospital Stay: Admission: RE | Admit: 2022-01-26 | Payer: Self-pay | Source: Ambulatory Visit

## 2022-01-29 ENCOUNTER — Other Ambulatory Visit: Payer: Self-pay

## 2022-04-21 ENCOUNTER — Other Ambulatory Visit: Payer: Self-pay | Admitting: Nurse Practitioner

## 2022-04-21 DIAGNOSIS — K760 Fatty (change of) liver, not elsewhere classified: Secondary | ICD-10-CM

## 2022-04-21 DIAGNOSIS — K7402 Hepatic fibrosis, advanced fibrosis: Secondary | ICD-10-CM

## 2022-04-23 ENCOUNTER — Ambulatory Visit
Admission: RE | Admit: 2022-04-23 | Discharge: 2022-04-23 | Disposition: A | Discharge status: Home / Self Care | Payer: Commercial Managed Care - PPO | Source: Ambulatory Visit | Attending: Nurse Practitioner | Admitting: Nurse Practitioner

## 2022-04-23 DIAGNOSIS — K760 Fatty (change of) liver, not elsewhere classified: Secondary | ICD-10-CM

## 2022-04-23 DIAGNOSIS — K7402 Hepatic fibrosis, advanced fibrosis: Secondary | ICD-10-CM

## 2022-12-31 ENCOUNTER — Ambulatory Visit: Payer: No Typology Code available for payment source | Admitting: Urology

## 2022-12-31 ENCOUNTER — Encounter: Payer: Self-pay | Admitting: Urology

## 2022-12-31 VITALS — BP 127/90 | HR 89 | Ht 72.0 in | Wt 300.0 lb

## 2022-12-31 DIAGNOSIS — N50812 Left testicular pain: Secondary | ICD-10-CM

## 2022-12-31 DIAGNOSIS — N430 Encysted hydrocele: Secondary | ICD-10-CM | POA: Diagnosis not present

## 2022-12-31 DIAGNOSIS — N503 Cyst of epididymis: Secondary | ICD-10-CM | POA: Diagnosis not present

## 2022-12-31 DIAGNOSIS — M5440 Lumbago with sciatica, unspecified side: Secondary | ICD-10-CM | POA: Diagnosis not present

## 2022-12-31 DIAGNOSIS — N50811 Right testicular pain: Secondary | ICD-10-CM | POA: Diagnosis not present

## 2022-12-31 NOTE — Progress Notes (Signed)
Subjective: 1. Pain in both testicles   2. Encysted hydrocele   3. Epididymal cyst   4. Acute left-sided low back pain with sciatica, sciatica laterality unspecified      Consult requested by Wallace Going MD  12/31/22: Samuel Wood is a 32 yo male who was in the ER at [facility] for pain in the left lower back that radiated into both testes.  He had burning with urination and urgency but that has resolved.  He had nausea and vomiting with the pain.   He continues to have pain.  He has a history of stones and UTI's with the last about a year ago.  He had a scrotal US and he was noted to have a small hydrocele and epididymal cysts.  His pain is positional with sitting, standing and walking.  He had a CT in July for pain and had no stones.  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  The UA was clear on 12/28/22. CBC and CMP were unermarkable.    ROS:  Review of Systems  Musculoskeletal:  Positive for back pain.  All other systems reviewed and are negative.   Allergies  Allergen Reactions   Aspirin Itching, Swelling and Rash    Past Medical History:  Diagnosis Date   Depression    Diabetes mellitus without complication (HCC)    Hypertension     Past Surgical History:  Procedure Laterality Date   CYST REMOVAL NECK  2022    Social History   Socioeconomic History   Marital status: Single    Spouse name: Not on file   Number of children: Not on file   Years of education: Not on file   Highest education level: Not on file  Occupational History   Not on file  Tobacco Use   Smoking status: Some Days    Types: Cigarettes   Smokeless tobacco: Never  Substance and Sexual Activity   Alcohol use: Not Currently   Drug use: No   Sexual activity: Not on file  Other Topics Concern   Not on file  Social History Narrative   Not on file   Social Determinants of Health   Financial Resource Strain: Not on file  Food Insecurity: Low Risk  (12/10/2022)   Received from [facility]   Hunger Vital  Sign    Worried About Running Out of Food in the Last Year: Never true    Ran Out of Food in the Last Year: Never true  Transportation Needs: Not on file (12/10/2022)  Physical Activity: Not on file  Stress: Not on file  Social Connections: Not on file  Intimate Partner Violence: Not on file    History reviewed. No pertinent family history.  Anti-infectives: Anti-infectives (From admission, onward)    None       Current Outpatient Medications  Medication Sig Dispense Refill   aspirin 81 MG EC tablet Take 81 mg by mouth daily.     Cholecalciferol (VITAMIN D3) 50 MCG (2000 UT) TABS Take 1 tablet by mouth daily.     citalopram (CELEXA) 20 MG tablet Take 20 mg by mouth daily.     cyclobenzaprine (FLEXERIL) 10 MG tablet Take 1 tablet (10 mg total) by mouth 2 (two) times daily as needed for muscle spasms. 20 tablet 0   ibuprofen (ADVIL) 800 MG tablet Take 1 tablet (800 mg total) by mouth 3 (three) times daily. 21 tablet 0   ketorolac (TORADOL) 10 MG tablet Take 10 mg by mouth every 6 (  six) hours.     methocarbamol (ROBAXIN) 500 MG tablet Take 500 mg by mouth 2 (two) times daily.     traZODone (DESYREL) 100 MG tablet Take 100 mg by mouth at bedtime.     albuterol (VENTOLIN HFA) 108 (90 Base) MCG/ACT inhaler Inhale 2 puffs into the lungs every 4 (four) hours as needed for shortness of breath.     No current facility-administered medications for this visit.     Objective: Vital signs in last 24 hours: BP (!) 127/90   Pulse 89   Ht 6' (1.829 m)   Wt 300 lb (136.1 kg)   BMI 40.69 kg/m   Intake/Output from previous day: No intake/output data recorded. Intake/Output this shift: @IOTHISSHIFT @   Physical Exam Vitals reviewed.  Constitutional:      Appearance: Normal appearance. He is obese.  Abdominal:     General: Abdomen is flat.     Tenderness: There is no abdominal tenderness.     Hernia: No hernia is present.  Genitourinary:    Comments: Nl phallus with mild meatal  stenosis. Scrotum normal. Testes normal.   Small right hydrocele  Small left epididymal cysts.  Musculoskeletal:        General: No swelling or tenderness. Normal range of motion.  Skin:    General: Skin is warm and dry.  Neurological:     General: No focal deficit present.     Mental Status: He is alert and oriented to person, place, and time.     Lab Results:  No results found for this or any previous visit (from the past 24 hour(s)).  BMET No results for input(s): "NA", "K", "CL", "CO2", "GLUCOSE", "BUN", "CREATININE", "CALCIUM" in the last 72 hours. PT/INR No results for input(s): "LABPROT", "INR" in the last 72 hours. ABG No results for input(s): "PHART", "HCO3" in the last 72 hours.  Invalid input(s): "PCO2", "PO2" I have reviewed his UA and labs from [facility] Studies/Results: No results found. I have reviewed his CT films from 7/24. I have reviewed his [place] films and report.   Assessment/Plan: Testicular pain with the incidental finding of a small hydrocele and epididymal cysts.   There is no evidence of infection and I think his pain is referred and possible radicular.   He has some lumber DDD on CT.   He will continue the ketorolac and muscle relaxer.   He doesn't need intervention for the testicular findings.  He needs to avoid lifting and may benefit from PT.   No orders of the defined types were placed in this encounter.    No orders of the defined types were placed in this encounter.    Return if symptoms worsen or fail to improve.    CC: Dr. Ree Edman.      Bjorn Pippin 01/01/2023

## 2023-09-13 ENCOUNTER — Other Ambulatory Visit: Payer: Self-pay

## 2023-09-13 ENCOUNTER — Emergency Department (HOSPITAL_COMMUNITY)
Admission: EM | Admit: 2023-09-13 | Discharge: 2023-09-13 | Disposition: A | Discharge status: Home / Self Care | Attending: Emergency Medicine | Admitting: Emergency Medicine

## 2023-09-13 ENCOUNTER — Encounter (HOSPITAL_COMMUNITY): Payer: Self-pay | Admitting: *Deleted

## 2023-09-13 DIAGNOSIS — Z7982 Long term (current) use of aspirin: Secondary | ICD-10-CM | POA: Insufficient documentation

## 2023-09-13 DIAGNOSIS — K0889 Other specified disorders of teeth and supporting structures: Secondary | ICD-10-CM | POA: Diagnosis present

## 2023-09-13 DIAGNOSIS — L509 Urticaria, unspecified: Secondary | ICD-10-CM | POA: Insufficient documentation

## 2023-09-13 MED ORDER — KETOROLAC TROMETHAMINE 30 MG/ML IJ SOLN
30.0000 mg | Freq: Once | INTRAMUSCULAR | Status: AC
  Administered 2023-09-13: 30 mg via INTRAMUSCULAR
  Filled 2023-09-13: qty 1

## 2023-09-13 MED ORDER — LIDOCAINE VISCOUS HCL 2 % MT SOLN
15.0000 mL | Freq: Once | OROMUCOSAL | Status: AC
  Administered 2023-09-13: 15 mL via OROMUCOSAL
  Filled 2023-09-13: qty 15

## 2023-09-13 MED ORDER — AMOXICILLIN-POT CLAVULANATE 875-125 MG PO TABS: 1.0000 | ORAL_TABLET | Freq: Two times a day (BID) | ORAL | 0 refills | Status: DC

## 2023-09-13 MED ORDER — DIPHENHYDRAMINE HCL 25 MG PO CAPS
50.0000 mg | ORAL_CAPSULE | Freq: Once | ORAL | Status: AC
  Administered 2023-09-13: 50 mg via ORAL
  Filled 2023-09-13: qty 2

## 2023-09-13 NOTE — ED Triage Notes (Signed)
 Pt with broken tooth for past 2 months, to left upper molar. Left side HA and facial pain.

## 2023-09-13 NOTE — ED Provider Notes (Signed)
 [place] EMERGENCY DEPARTMENT AT [facility] Provider Note   CSN: [id number] Arrival date & time: 09/13/23  1255     History  Chief Complaint  Patient presents with   Dental Pain    Samuel Wood is a 33 y.o. male.  HPI 33 year old male presents with concern for dental pain.  Patient states that his left upper molar broke about a month ago.  Over the last 2 days or so he has been having severe pain to his tooth.  He feels like the pain is going into his face and causing a headache.  Patient took a dose of leftover clindamycin yesterday evening.  A couple hours later he broke and a rash on his back and left arm and feels itchy.  No breathing difficulty.  Home Medications Prior to Admission medications   Medication Sig Start Date End Date Taking? Authorizing Provider  amoxicillin-clavulanate (AUGMENTIN) 875-125 MG tablet Take 1 tablet by mouth every 12 (twelve) hours. 09/13/23  Yes Jerilynn Montenegro, MD  albuterol (VENTOLIN HFA) 108 (90 Base) MCG/ACT inhaler Inhale 2 puffs into the lungs every 4 (four) hours as needed for shortness of breath. 03/03/20 03/03/21  [provider]  aspirin 81 MG EC tablet Take 81 mg by mouth daily.    [provider]  Cholecalciferol (VITAMIN D3) 50 MCG (2000 UT) TABS Take 1 tablet by mouth daily.    [provider]  citalopram (CELEXA) 20 MG tablet Take 20 mg by mouth daily. 09/11/20   [provider]  cyclobenzaprine  (FLEXERIL ) 10 MG tablet Take 1 tablet (10 mg total) by mouth 2 (two) times daily as needed for muscle spasms. 07/13/19   Gigi Kyle, NP  ibuprofen  (ADVIL ) 800 MG tablet Take 1 tablet (800 mg total) by mouth 3 (three) times daily. 07/13/19   Gigi Kyle, NP  traZODone (DESYREL) 100 MG tablet Take 100 mg by mouth at bedtime. 09/11/20   [provider]      Allergies    Aspirin and Clindamycin/lincomycin    Review of Systems   Review of Systems  Constitutional:  Negative for fever.   HENT:  Positive for dental problem. Negative for facial swelling and trouble swallowing.   Respiratory:  Negative for shortness of breath.   Skin:  Positive for rash.    Physical Exam Updated Vital Signs BP (!) 146/88 (BP Location: Right Arm)   Pulse 68   Temp 97.8 F (36.6 C) (Oral)   Resp 20   Ht 6' (1.829 m)   Wt 133.8 kg   SpO2 98%   BMI 40.01 kg/m  Physical Exam Vitals and nursing note reviewed.  Constitutional:      Appearance: He is well-developed. He is obese.  HENT:     Head: Normocephalic and atraumatic.     Mouth/Throat:      Comments: No oropharyngeal swelling Cardiovascular:     Rate and Rhythm: Normal rate and regular rhythm.     Heart sounds: Normal heart sounds.  Pulmonary:     Effort: Pulmonary effort is normal.     Breath sounds: Normal breath sounds. No stridor. No wheezing, rhonchi or rales.  Skin:    General: Skin is warm and dry.     Findings: Rash present.     Comments: Scattered collections of hives to the back as well as the left arm  Neurological:     Mental Status: He is alert.     ED Results / Procedures / Treatments  Labs (all labs ordered are listed, but only abnormal results are displayed) Labs Reviewed - No data to display  EKG None  Radiology No results found.  Procedures Procedures    Medications Ordered in ED Medications  ketorolac  (TORADOL ) 30 MG/ML injection 30 mg (30 mg Intramuscular Given 09/13/23 1430)  diphenhydrAMINE (BENADRYL) capsule 50 mg (50 mg Oral Given 09/13/23 1432)  lidocaine (XYLOCAINE) 2 % viscous mouth solution 15 mL (15 mLs Mouth/Throat Given 09/13/23 1432)    ED Course/ Medical Decision Making/ A&P                                 Medical Decision Making Risk Prescription drug management.   It seems like patient is having a rash associate with clindamycin.  I have instructed him not to take this and will give a dose of Benadryl.  For his tooth, sounds like it broke a month ago and now he is  having pain that could be from pulpitis from an infection.  Will treat with Augmentin.  Will give a dose of Toradol  as he has been taking NSAIDs at home without difficulty.  Otherwise, will give a dose of viscous lidocaine and have him follow-up with his dentist.  No signs of anaphylaxis.  Highly doubt deep space infection from the tooth at this time.  No facial swelling appreciated.  No neck swelling.  Will discharge home with return precautions.        Final Clinical Impression(s) / ED Diagnoses Final diagnoses:  Pain, dental  Hives    Rx / DC Orders ED Discharge Orders          Ordered    amoxicillin-clavulanate (AUGMENTIN) 875-125 MG tablet  Every 12 hours        09/13/23 1431              Jerilynn Montenegro, MD 09/13/23 1448

## 2023-09-13 NOTE — Discharge Instructions (Signed)
 We are putting you on Augmentin to help treat what is probably a dental infection.  You need to call your dentist tomorrow to set up an urgent outpatient examination.  If you develop fever, swelling in your mouth, trouble breathing or swallowing, or any other new/concerning symptoms and return to the ER.  Otherwise, continue to use Tylenol  and ibuprofen  and the topical gel.  It appears you are likely allergic to the clindamycin, stop this immediately and do not take any further doses.  Follow-up with your primary care physician.

## 2023-09-13 NOTE — ED Notes (Signed)
 Pt provided discharge instructions and prescription information. Pt was given the opportunity to ask questions and questions were answered.

## 2023-09-13 NOTE — ED Notes (Signed)
 Pt states he found some expired clindamycin at home and took a dose last night. Now he has a pruritic rash on his back and L arm.

## 2023-09-27 ENCOUNTER — Emergency Department (HOSPITAL_COMMUNITY)

## 2023-09-27 ENCOUNTER — Other Ambulatory Visit: Payer: Self-pay

## 2023-09-27 ENCOUNTER — Emergency Department (HOSPITAL_COMMUNITY)
Admission: EM | Admit: 2023-09-27 | Discharge: 2023-09-27 | Disposition: A | Discharge status: Home / Self Care | Attending: Emergency Medicine | Admitting: Emergency Medicine

## 2023-09-27 ENCOUNTER — Encounter (HOSPITAL_COMMUNITY): Payer: Self-pay | Admitting: Emergency Medicine

## 2023-09-27 DIAGNOSIS — R0789 Other chest pain: Secondary | ICD-10-CM | POA: Diagnosis not present

## 2023-09-27 DIAGNOSIS — Z79899 Other long term (current) drug therapy: Secondary | ICD-10-CM | POA: Diagnosis not present

## 2023-09-27 DIAGNOSIS — E669 Obesity, unspecified: Secondary | ICD-10-CM | POA: Insufficient documentation

## 2023-09-27 DIAGNOSIS — R079 Chest pain, unspecified: Secondary | ICD-10-CM

## 2023-09-27 DIAGNOSIS — F17210 Nicotine dependence, cigarettes, uncomplicated: Secondary | ICD-10-CM | POA: Insufficient documentation

## 2023-09-27 DIAGNOSIS — E119 Type 2 diabetes mellitus without complications: Secondary | ICD-10-CM | POA: Insufficient documentation

## 2023-09-27 DIAGNOSIS — I10 Essential (primary) hypertension: Secondary | ICD-10-CM | POA: Insufficient documentation

## 2023-09-27 DIAGNOSIS — Z7982 Long term (current) use of aspirin: Secondary | ICD-10-CM | POA: Insufficient documentation

## 2023-09-27 LAB — COMPREHENSIVE METABOLIC PANEL WITH GFR
ALT: 25 U/L (ref 0–44)
AST: 24 U/L (ref 15–41)
Albumin: 3.6 g/dL (ref 3.5–5.0)
Alkaline Phosphatase: 78 U/L (ref 38–126)
Anion gap: 9 (ref 5–15)
BUN: 10 mg/dL (ref 6–20)
CO2: 25 mmol/L (ref 22–32)
Calcium: 8.8 mg/dL — ABNORMAL LOW (ref 8.9–10.3)
Chloride: 106 mmol/L (ref 98–111)
Creatinine, Ser: 0.75 mg/dL (ref 0.61–1.24)
GFR, Estimated: >60 mL/min (ref >60)
Glucose, Bld: 122 mg/dL — ABNORMAL HIGH (ref 70–99)
Potassium: 3.5 mmol/L (ref 3.5–5.1)
Sodium: 140 mmol/L (ref 135–145)
Total Bilirubin: 1.1 mg/dL (ref 0.0–1.2)
Total Protein: 7 g/dL (ref 6.5–8.1)

## 2023-09-27 LAB — CBC WITH DIFFERENTIAL/PLATELET
Abs Immature Granulocytes: 0.05 10*3/uL (ref 0.00–0.07)
Basophils Absolute: 0 10*3/uL (ref 0.0–0.1)
Basophils Relative: 0 %
Eosinophils Absolute: 0.2 10*3/uL (ref 0.0–0.5)
Eosinophils Relative: 3 %
HCT: 47.2 % (ref 39.0–52.0)
Hemoglobin: 15.3 g/dL (ref 13.0–17.0)
Immature Granulocytes: 1 %
Lymphocytes Relative: 30 %
Lymphs Abs: 2.2 10*3/uL (ref 0.7–4.0)
MCH: 27.9 pg (ref 26.0–34.0)
MCHC: 32.4 g/dL (ref 30.0–36.0)
MCV: 86 fL (ref 80.0–100.0)
Monocytes Absolute: 0.5 10*3/uL (ref 0.1–1.0)
Monocytes Relative: 7 %
Neutro Abs: 4.5 10*3/uL (ref 1.7–7.7)
Neutrophils Relative %: 59 %
Platelets: 272 10*3/uL (ref 150–400)
RBC: 5.49 MIL/uL (ref 4.22–5.81)
RDW: 13.2 % (ref 11.5–15.5)
WBC: 7.5 10*3/uL (ref 4.0–10.5)
nRBC: 0 % (ref 0.0–0.2)

## 2023-09-27 LAB — TROPONIN I (HIGH SENSITIVITY)
Troponin I (High Sensitivity): 3 ng/L (ref <18)
Troponin I (High Sensitivity): 3 ng/L (ref <18)

## 2023-09-27 LAB — BRAIN NATRIURETIC PEPTIDE: B Natriuretic Peptide: 23 pg/mL (ref 0.0–100.0)

## 2023-09-27 LAB — RESP PANEL BY RT-PCR (RSV, FLU A&B, COVID)  RVPGX2
Influenza A by PCR: NEGATIVE
Influenza B by PCR: NEGATIVE
Resp Syncytial Virus by PCR: NEGATIVE
SARS Coronavirus 2 by RT PCR: NEGATIVE

## 2023-09-27 MED ORDER — CYCLOBENZAPRINE HCL 10 MG PO TABS: 10.0000 mg | ORAL_TABLET | Freq: Two times a day (BID) | ORAL | 0 refills | Status: AC | PRN

## 2023-09-27 MED ORDER — LIDOCAINE 5 % EX PTCH: 1.0000 | MEDICATED_PATCH | Freq: Every day | CUTANEOUS | 0 refills | Status: AC | PRN

## 2023-09-27 MED ORDER — IPRATROPIUM-ALBUTEROL 0.5-2.5 (3) MG/3ML IN SOLN
3.0000 mL | Freq: Once | RESPIRATORY_TRACT | Status: AC
  Administered 2023-09-27: 3 mL via RESPIRATORY_TRACT
  Filled 2023-09-27: qty 3

## 2023-09-27 MED ORDER — ACETAMINOPHEN 325 MG PO TABS: 650.0000 mg | ORAL_TABLET | Freq: Four times a day (QID) | ORAL | 0 refills | Status: AC | PRN

## 2023-09-27 MED ORDER — ALBUTEROL SULFATE HFA 108 (90 BASE) MCG/ACT IN AERS: 1.0000 | INHALATION_SPRAY | Freq: Four times a day (QID) | RESPIRATORY_TRACT | 0 refills | Status: AC | PRN

## 2023-09-27 MED ORDER — CYCLOBENZAPRINE HCL 10 MG PO TABS
10.0000 mg | ORAL_TABLET | Freq: Once | ORAL | Status: AC
  Administered 2023-09-27: 10 mg via ORAL
  Filled 2023-09-27: qty 1

## 2023-09-27 NOTE — Discharge Instructions (Addendum)
 Return to the Emergency Department if you have unusual chest pain, pressure, or discomfort, shortness of breath, nausea, vomiting, burping, heartburn, tingling upper body parts, sweating, cold, clammy skin, or racing heartbeat. Call 911 if you think you are having a heart attack. Follow cardiac diet - avoid fatty & fried foods, don't eat too much red meat, eat lots of fruits & vegetables, and dairy products should be low fat. Please lose weight if you are overweight. Become more active with walking, gardening, or any other activity that gets you to moving.  Please stop smoking   Please return to the emergency department immediately for any new or concerning symptoms, or if you get worse.

## 2023-09-27 NOTE — ED Provider Notes (Signed)
 [place] EMERGENCY DEPARTMENT AT [facility] Provider Note  CSN: [id number] Arrival date & time: 09/27/23 7829  Chief Complaint(s) Chest Pain  HPI GANON DEMASI is a 33 y.o. male with past medical history as below, significant for depression, DM, HTN who presents to the ED with complaint of cp/dib  Patient reports around midnight last night began having right-sided chest tightness, heaviness sensation with some exertional dyspnea.  Feels his legs are slightly swollen compared to normal.  Has been having occasional cough with intermittent yellow or white sputum.  No fevers or chills, abdominal pain nausea or vomiting.  No syncope or near syncope.  Multiple sick contacts with URI type symptoms.  Past Medical History Past Medical History:  Diagnosis Date   Depression    Diabetes mellitus without complication (HCC)    Hypertension    There are no active problems to display for this patient.  Home Medication(s) Prior to Admission medications   Medication Sig Start Date End Date Taking? Authorizing Provider  acetaminophen  (TYLENOL ) 325 MG tablet Take 2 tablets (650 mg total) by mouth every 6 (six) hours as needed. 09/27/23  Yes Russella Courts A, DO  albuterol (VENTOLIN HFA) 108 (90 Base) MCG/ACT inhaler Inhale 1-2 puffs into the lungs every 6 (six) hours as needed for wheezing or shortness of breath. 09/27/23  Yes Russella Courts A, DO  cyclobenzaprine  (FLEXERIL ) 10 MG tablet Take 1 tablet (10 mg total) by mouth 2 (two) times daily as needed for muscle spasms. 09/27/23  Yes Russella Courts A, DO  lidocaine  (LIDODERM ) 5 % Place 1 patch onto the skin daily as needed. Remove & Discard patch within 12 hours or as directed by MD 09/27/23  Yes Teddi Favors, DO  amoxicillin -clavulanate (AUGMENTIN ) 875-125 MG tablet Take 1 tablet by mouth every 12 (twelve) hours. 09/13/23   Jerilynn Montenegro, MD  aspirin 81 MG EC tablet Take 81 mg by mouth daily.    [provider]  Cholecalciferol (VITAMIN D3)  50 MCG (2000 UT) TABS Take 1 tablet by mouth daily.    [provider]  citalopram (CELEXA) 20 MG tablet Take 20 mg by mouth daily. 09/11/20   [provider]  ibuprofen  (ADVIL ) 800 MG tablet Take 1 tablet (800 mg total) by mouth 3 (three) times daily. 07/13/19   Gigi Kyle, NP  traZODone (DESYREL) 100 MG tablet Take 100 mg by mouth at bedtime. 09/11/20   [provider]                                                                                                                                    Past Surgical History Past Surgical History:  Procedure Laterality Date   CYST REMOVAL NECK  2022   Family History History reviewed. No pertinent family history.  Social History Social History   Tobacco Use   Smoking status: Some Days    Types: Cigarettes  Smokeless tobacco: Never  Substance Use Topics   Alcohol use: Not Currently   Drug use: No   Allergies Aspirin and Clindamycin/lincomycin  Review of Systems A thorough review of systems was obtained and all systems are negative except as noted in the HPI and PMH.   Physical Exam Vital Signs  I have reviewed the triage vital signs BP (!) 113/55   Pulse 65   Resp 16   Ht 6' (1.829 m)   Wt 133.8 kg   SpO2 100%   BMI 40.01 kg/m  Physical Exam Vitals and nursing note reviewed.  Constitutional:      General: He is not in acute distress.    Appearance: He is well-developed. He is obese.  HENT:     Head: Normocephalic and atraumatic.     Right Ear: External ear normal.     Left Ear: External ear normal.     Mouth/Throat:     Mouth: Mucous membranes are moist.  Eyes:     General: No scleral icterus. Cardiovascular:     Rate and Rhythm: Normal rate and regular rhythm.     Pulses: Normal pulses.     Heart sounds: Normal heart sounds.  Pulmonary:     Effort: Pulmonary effort is normal. No respiratory distress.     Breath sounds: Normal breath sounds.  Chest:    Abdominal:     General:  Abdomen is flat.     Palpations: Abdomen is soft.     Tenderness: There is no abdominal tenderness.  Musculoskeletal:     Cervical back: No rigidity.     Right lower leg: Edema present.     Left lower leg: Edema present.     Comments: Trace b/l  Skin:    General: Skin is warm and dry.     Capillary Refill: Capillary refill takes less than 2 seconds.  Neurological:     Mental Status: He is alert.  Psychiatric:        Mood and Affect: Mood normal.        Behavior: Behavior normal.     ED Results and Treatments Labs (all labs ordered are listed, but only abnormal results are displayed) Labs Reviewed  COMPREHENSIVE METABOLIC PANEL WITH GFR - Abnormal; Notable for the following components:      Result Value   Glucose, Bld 122 (*)    Calcium 8.8 (*)    All other components within normal limits  RESP PANEL BY RT-PCR (RSV, FLU A&B, COVID)  RVPGX2  CBC WITH DIFFERENTIAL/PLATELET  BRAIN NATRIURETIC PEPTIDE  TROPONIN I (HIGH SENSITIVITY)  TROPONIN I (HIGH SENSITIVITY)                                                                                                                          Radiology DG Chest 2 View Result Date: 09/27/2023 CLINICAL DATA:  Chest pain. EXAM: CHEST - 2 VIEW COMPARISON:  03/03/2020. FINDINGS: Low lung volume. Bilateral lung fields are clear. Bilateral  costophrenic angles are clear. Normal cardio-mediastinal silhouette. No acute osseous abnormalities. The soft tissues are within normal limits. IMPRESSION: No active cardiopulmonary disease. Electronically Signed   By: Beula Brunswick M.D.   On: 09/27/2023 09:43    Pertinent labs & imaging results that were available during my care of the patient were reviewed by me and considered in my medical decision making (see MDM for details).  Medications Ordered in ED Medications  ipratropium-albuterol (DUONEB) 0.5-2.5 (3) MG/3ML nebulizer solution 3 mL (3 mLs Nebulization Given 09/27/23 1135)  cyclobenzaprine  (FLEXERIL )  tablet 10 mg (10 mg Oral Given 09/27/23 1052)                                                                                                                                     Procedures Procedures  (including critical care time)  Medical Decision Making / ED Course    Medical Decision Making:    ARTEMIO DOBIE is a 33 y.o. male with past medical history as below, significant for depression, DM, HTN who presents to the ED with complaint of cp/dib. The complaint involves an extensive differential diagnosis and also carries with it a high risk of complications and morbidity.  Serious etiology was considered. Ddx includes but is not limited to: In my evaluation of this patient's dyspnea my DDx includes, but is not limited to, pneumonia, pulmonary embolism, pneumothorax, pulmonary edema, metabolic acidosis, asthma, COPD, cardiac cause, anemia, anxiety, etc.    Complete initial physical exam performed, notably the patient was in no distress no hypoxia.    Reviewed and confirmed nursing documentation for past medical history, family history, social history.  Vital signs reviewed.     Brief summary:  33 year old male history above here with chest pain, dyspnea, leg swelling No hypoxia or increased work of breathing.  Lungs clear bilateral.  He has trace edema to his lower extremities.  Chest pain is reproducible       The patient's chest pain is not suggestive of pulmonary embolus, cardiac ischemia, aortic dissection, pericarditis, myocarditis, pulmonary embolism, pneumothorax, pneumonia, Zoster, or esophageal perforation, or other serious etiology.  Historically not abrupt in onset, tearing or ripping, pulses symmetric. EKG nonspecific for ischemia/infarction. No dysrhythmias, brugada, WPW, prolonged QT noted.   Troponin negative x2. CXR reviewed. Labs without demonstration of acute pathology unless otherwise noted above. Low HEART Score: 0-3 points (0.9-1.7% risk of MACE).  Feeling much  better, concern possible viral URI. CP likely atypical  Given the extremely low risk of these diagnoses further testing and evaluation for these possibilities does not appear to be indicated at this time. Patient in no distress and overall condition improved here in the ED. Detailed discussions were had with the patient regarding current findings, and need for close f/u with PCP or on call doctor. The patient has been instructed to return immediately if the symptoms worsen in any way for re-evaluation. Patient verbalized understanding  and is in agreement with current care plan. All questions answered prior to discharge.              Additional history obtained: -Additional history obtained from friend -External records from outside source obtained and reviewed including: Chart review including previous notes, labs, imaging, consultation notes including  Home medications, allergy list prior labs   Lab Tests: -I ordered, reviewed, and interpreted labs.   The pertinent results include:   Labs Reviewed  COMPREHENSIVE METABOLIC PANEL WITH GFR - Abnormal; Notable for the following components:      Result Value   Glucose, Bld 122 (*)    Calcium 8.8 (*)    All other components within normal limits  RESP PANEL BY RT-PCR (RSV, FLU A&B, COVID)  RVPGX2  CBC WITH DIFFERENTIAL/PLATELET  BRAIN NATRIURETIC PEPTIDE  TROPONIN I (HIGH SENSITIVITY)  TROPONIN I (HIGH SENSITIVITY)    Notable for labs stable, troponin negative  EKG   EKG Interpretation Date/Time:  Monday September 27 2023 09:05:20 EDT Ventricular Rate:  64 PR Interval:  155 QRS Duration:  102 QT Interval:  417 QTC Calculation: 431 R Axis:   75  Text Interpretation: Sinus rhythm Abnormal inferior Q waves Confirmed by Russella Courts ([id number]) on 09/27/2023 9:15:08 AM         Imaging Studies ordered: I ordered imaging studies including x-ray chest I independently visualized the following imaging with scope of interpretation  limited to determining acute life threatening conditions related to emergency care; findings noted above I agree with the radiologist interpretation If any imaging was obtained with contrast I closely monitored patient for any possible adverse reaction a/w contrast administration in the emergency department   Medicines ordered and prescription drug management: Meds ordered this encounter  Medications   ipratropium-albuterol (DUONEB) 0.5-2.5 (3) MG/3ML nebulizer solution 3 mL   cyclobenzaprine  (FLEXERIL ) tablet 10 mg   albuterol (VENTOLIN HFA) 108 (90 Base) MCG/ACT inhaler    Sig: Inhale 1-2 puffs into the lungs every 6 (six) hours as needed for wheezing or shortness of breath.    Dispense:  8.5 g    Refill:  0   acetaminophen  (TYLENOL ) 325 MG tablet    Sig: Take 2 tablets (650 mg total) by mouth every 6 (six) hours as needed.    Dispense:  36 tablet    Refill:  0   cyclobenzaprine  (FLEXERIL ) 10 MG tablet    Sig: Take 1 tablet (10 mg total) by mouth 2 (two) times daily as needed for muscle spasms.    Dispense:  20 tablet    Refill:  0   lidocaine  (LIDODERM ) 5 %    Sig: Place 1 patch onto the skin daily as needed. Remove & Discard patch within 12 hours or as directed by MD    Dispense:  15 patch    Refill:  0    -I have reviewed the patients home medicines and have made adjustments as needed   Consultations Obtained: na   Cardiac Monitoring: The patient was maintained on a cardiac monitor.  I personally viewed and interpreted the cardiac monitored which showed an underlying rhythm of: nsr Continuous pulse oximetry interpreted by myself, 98% on RA.    Social Determinants of Health:  Diagnosis or treatment significantly limited by social determinants of health: current smoker and obesity   Reevaluation: After the interventions noted above, I reevaluated the patient and found that they have improved  Co morbidities that complicate the patient evaluation  Past Medical  History:  Diagnosis Date   Depression    Diabetes mellitus without complication (HCC)    Hypertension       Dispostion: Disposition decision including need for hospitalization was considered, and patient discharged from emergency department.    Final Clinical Impression(s) / ED Diagnoses Final diagnoses:  Chest pain with low risk for cardiac etiology        Teddi Favors, DO 09/27/23 1219

## 2023-09-27 NOTE — ED Triage Notes (Signed)
 Pt c/o center chest pain with SOB, lightheadedness, and bilateral leg swelling since 0000, denies cardiac hx, n/v

## 2023-12-30 ENCOUNTER — Emergency Department (HOSPITAL_COMMUNITY)

## 2023-12-30 ENCOUNTER — Other Ambulatory Visit: Payer: Self-pay

## 2023-12-30 ENCOUNTER — Encounter (HOSPITAL_COMMUNITY): Payer: Self-pay | Admitting: *Deleted

## 2023-12-30 ENCOUNTER — Emergency Department (HOSPITAL_COMMUNITY)
Admission: EM | Admit: 2023-12-30 | Discharge: 2023-12-31 | Disposition: A | Discharge status: Home / Self Care | Attending: Emergency Medicine | Admitting: Emergency Medicine

## 2023-12-30 DIAGNOSIS — R0602 Shortness of breath: Secondary | ICD-10-CM | POA: Diagnosis not present

## 2023-12-30 DIAGNOSIS — M7989 Other specified soft tissue disorders: Secondary | ICD-10-CM | POA: Diagnosis present

## 2023-12-30 DIAGNOSIS — R600 Localized edema: Secondary | ICD-10-CM | POA: Insufficient documentation

## 2023-12-30 DIAGNOSIS — Z5321 Procedure and treatment not carried out due to patient leaving prior to being seen by health care provider: Secondary | ICD-10-CM | POA: Diagnosis not present

## 2023-12-30 LAB — BASIC METABOLIC PANEL WITH GFR
Anion gap: 11 (ref 5–15)
BUN: 9 mg/dL (ref 6–20)
CO2: 27 mmol/L (ref 22–32)
Calcium: 9.4 mg/dL (ref 8.9–10.3)
Chloride: 103 mmol/L (ref 98–111)
Creatinine, Ser: 0.96 mg/dL (ref 0.61–1.24)
GFR, Estimated: >60 mL/min (ref >60)
Glucose, Bld: 99 mg/dL (ref 70–99)
Potassium: 4 mmol/L (ref 3.5–5.1)
Sodium: 141 mmol/L (ref 135–145)

## 2023-12-30 LAB — CBC
HCT: 49 % (ref 39.0–52.0)
Hemoglobin: 15.7 g/dL (ref 13.0–17.0)
MCH: 27.4 pg (ref 26.0–34.0)
MCHC: 32 g/dL (ref 30.0–36.0)
MCV: 85.4 fL (ref 80.0–100.0)
Platelets: 301 K/uL (ref 150–400)
RBC: 5.74 MIL/uL (ref 4.22–5.81)
RDW: 13.3 % (ref 11.5–15.5)
WBC: 10.9 K/uL — ABNORMAL HIGH (ref 4.0–10.5)
nRBC: 0 % (ref 0.0–0.2)

## 2023-12-30 NOTE — ED Triage Notes (Signed)
 Pt states he has bilateral leg swelling x 2 weeks. Endorses pain, difficulty walking. Denies SOB, CP. States he does feel weaker than usual.

## 2023-12-30 NOTE — ED Triage Notes (Signed)
 Pt having BLE edema x 2 weeks. Has a referral to vein and speciality doctor however next appt in set for January .endorses shob. Increased swelling at night. Reports feeling lightheaded. Has been taking 800mg  ibuprofen  without improved on pain

## 2023-12-31 ENCOUNTER — Encounter (HOSPITAL_COMMUNITY): Payer: Self-pay | Admitting: Emergency Medicine

## 2023-12-31 ENCOUNTER — Emergency Department (HOSPITAL_COMMUNITY)
Admission: EM | Admit: 2023-12-31 | Discharge: 2023-12-31 | Disposition: A | Discharge status: Home / Self Care | Source: Home / Self Care | Attending: Emergency Medicine | Admitting: Emergency Medicine

## 2023-12-31 ENCOUNTER — Other Ambulatory Visit: Payer: Self-pay

## 2023-12-31 DIAGNOSIS — R600 Localized edema: Secondary | ICD-10-CM | POA: Insufficient documentation

## 2023-12-31 DIAGNOSIS — R0602 Shortness of breath: Secondary | ICD-10-CM | POA: Insufficient documentation

## 2023-12-31 MED ORDER — FUROSEMIDE 20 MG PO TABS: 20.0000 mg | ORAL_TABLET | ORAL | 0 refills | Status: AC | PRN

## 2023-12-31 MED ORDER — FUROSEMIDE 40 MG PO TABS
20.0000 mg | ORAL_TABLET | Freq: Once | ORAL | Status: AC
  Administered 2023-12-31: 20 mg via ORAL
  Filled 2023-12-31: qty 1

## 2023-12-31 MED ORDER — PANTOPRAZOLE SODIUM 40 MG PO TBEC
40.0000 mg | DELAYED_RELEASE_TABLET | Freq: Once | ORAL | Status: AC
  Administered 2023-12-31: 40 mg via ORAL
  Filled 2023-12-31: qty 1

## 2023-12-31 MED ORDER — PANTOPRAZOLE SODIUM 20 MG PO TBEC: 20.0000 mg | DELAYED_RELEASE_TABLET | Freq: Every day | ORAL | 0 refills | Status: AC

## 2023-12-31 NOTE — ED Triage Notes (Signed)
 Pt having BLE edema x2 weeks w/ sob. [facility] was [facility] earlier tonight and had labwork done but left d/t the wait.

## 2023-12-31 NOTE — ED Notes (Signed)
 Called pt 3x, and received no answer.

## 2023-12-31 NOTE — ED Provider Notes (Signed)
 [place] EMERGENCY DEPARTMENT AT [facility] Provider Note   CSN: [id number] Arrival date & time: 12/31/23  0542     History Chief Complaint  Patient presents with   Leg Swelling    HPI Samuel Wood is a 33 y.o. male presenting for chief complaint of BL LE swelling and SOB at work. States that it has been going on for a few weeks. 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  States that he has been noticing dyspnea at the end of his workdays, substantial lower extremity edema.  States he has gained approximately 20 pounds over the past year and recently he has started trying to diet.  Patient's recorded medical, surgical, social, medication list and allergies were reviewed in the Snapshot window as part of the initial history.   Review of Systems   Review of Systems  Constitutional:  Negative for chills and fever.  HENT:  Negative for ear pain and sore throat.   Eyes:  Negative for pain and visual disturbance.  Respiratory:  Negative for cough and shortness of breath.   Cardiovascular:  Positive for leg swelling. Negative for chest pain and palpitations.  Gastrointestinal:  Negative for abdominal pain and vomiting.  Genitourinary:  Negative for dysuria and hematuria.  Musculoskeletal:  Negative for arthralgias and back pain.  Skin:  Negative for color change and rash.  Neurological:  Negative for seizures and syncope.  All other systems reviewed and are negative.   Physical Exam Updated Vital Signs BP 139/88   Pulse 75   Temp 97.6 F (36.4 C) (Oral)   Ht 6' (1.829 m)   Wt 134 kg   SpO2 98%   BMI 40.07 kg/m  Physical Exam Vitals and nursing note reviewed.  Constitutional:      General: He is not in acute distress.    Appearance: He is well-developed.  HENT:     Head: Normocephalic and atraumatic.  Eyes:     Conjunctiva/sclera: Conjunctivae normal.  Cardiovascular:     Rate and Rhythm: Normal rate and regular rhythm.      Heart sounds: No murmur heard. Pulmonary:     Effort: Pulmonary effort is normal. No respiratory distress.     Breath sounds: Normal breath sounds.  Abdominal:     Palpations: Abdomen is soft.     Tenderness: There is no abdominal tenderness.  Musculoskeletal:        General: No swelling.     Cervical back: Neck supple.  Skin:    General: Skin is warm and dry.     Capillary Refill: Capillary refill takes less than 2 seconds.  Neurological:     Mental Status: He is alert.  Psychiatric:        Mood and Affect: Mood normal.      ED Course/ Medical Decision Making/ A&P    Procedures Procedures   Medications Ordered in ED Medications  furosemide  (LASIX ) tablet 20 mg (20 mg Oral Given 12/31/23 0623)  pantoprazole  (PROTONIX ) EC tablet 40 mg (40 mg Oral Given 12/31/23 9376)    Medical Decision Making:   33 year old male presenting with a chief complaint of leg swelling, shortness of breath, fatigue present over the past few months.  Does not have primary care follow-up. His exam is grossly benign, history of present illness and physical exam findings most consistent with dependent edema.  Considered heart failure, pneumonia, cardiac pathology all considered less likely.  He has had multiple cardiac  evaluations over the past year with negative serial troponins.  Chest x-ray performed at [facility] this morning reviewed and shows no evidence of pneumonia or cardiomegaly making heart failure less likely.  He states that just sitting in the chair in the waiting room overnight actually helped his lower extremity edema.  This most consistent with dependent edema.  No evidence of nephropathy or other etiology of patient's edema on lab work which was reviewed.  EKG consistent with prior.  No chest pain at this time. Will trial as needed Lasix  for lower extremity edema while he works on other supportive care including leg elevation and compression stockings.  He is also having worsening  heartburn which we will treat with Protonix  for the next 2 weeks.  Given the phone number for primary care follow-up and patient expressed understanding of importance of follow-up.  Clinical Impression:  1. Peripheral edema      Discharge   Final Clinical Impression(s) / ED Diagnoses Final diagnoses:  Peripheral edema    Rx / DC Orders ED Discharge Orders          Ordered    furosemide  (LASIX ) 20 MG tablet  As needed        12/31/23 0613    pantoprazole  (PROTONIX ) 20 MG tablet  Daily        12/31/23 9386              Jerral Meth, MD 12/31/23 [id number]

## 2024-02-29 ENCOUNTER — Other Ambulatory Visit: Payer: Self-pay

## 2024-02-29 DIAGNOSIS — I83813 Varicose veins of bilateral lower extremities with pain: Secondary | ICD-10-CM

## 2024-04-24 ENCOUNTER — Ambulatory Visit (HOSPITAL_COMMUNITY): Payer: Self-pay | Attending: Family Medicine

## 2024-04-24 ENCOUNTER — Ambulatory Visit (HOSPITAL_COMMUNITY): Admission: RE | Admit: 2024-04-24 | Payer: Self-pay | Source: Ambulatory Visit

## 2024-04-24 NOTE — Progress Notes (Deleted)
 "        [facility]           [facility] [place]  History and Physical   Samuel Wood is a 34 y.o. male who presents with ***  He has hx of hepatic fibrosis and MASH, hx of back pain with left sided sciatica.   The pt does *** have hx of previous venous procedures. The patient has *** history of DVT. Pt does *** history of varicose vein.   Pt does *** history of skin changes in lower legs.   There is *** family history of venous disorders.   The patient has *** used compression stockings in the past.    The pt is not on a statin for cholesterol management.  The pt is not on a daily aspirin.   Other AC:  none The pt is on diuretic for hypertension.   The pt is not on medication for diabetes.   Tobacco hx:  former  Pt does *** have family hx of AAA.  Past Medical History:  Diagnosis Date   Depression    Diabetes mellitus without complication (HCC)    Hypertension     Past Surgical History:  Procedure Laterality Date   CYST REMOVAL NECK  2022    Social History   Socioeconomic History   Marital status: Single    Spouse name: Not on file   Number of children: Not on file   Years of education: Not on file   Highest education level: Not on file  Occupational History   Not on file  Tobacco Use   Smoking status: Former    Types: Cigarettes   Smokeless tobacco: Never  Substance and Sexual Activity   Alcohol use: Not Currently   Drug use: No   Sexual activity: Not on file  Other Topics Concern   Not on file  Social History Narrative   Not on file   Social Drivers of Health   Tobacco Use: Medium Risk (12/31/2023)   Patient History    Smoking Tobacco Use: Former    Smokeless Tobacco Use: Never    Passive Exposure: Not on Actuary Strain: Not on file  Food Insecurity: No Food Insecurity (10/01/2023)   Received from [facility]   Epic    Within the past 12 months, you worried that your food would run out before you got the  money to buy more.: Never true    Within the past 12 months, the food you bought just didn't last and you didn't have money to get more.: Never true  Transportation Needs: No Transportation Needs (10/01/2023)   Received from [facility]   PRAPARE - Transportation    Lack of Transportation (Medical): No    Lack of Transportation (Non-Medical): No  Physical Activity: Not on file  Stress: Not on file  Social Connections: Not on file  Intimate Partner Violence: Not on file  Depression (EYV7-0): Not on file  Alcohol Screen: Not on file  Housing: Low Risk (12/10/2022)   Received from [facility]   Epic    What is your living situation today?: I have a steady place to live    Think about the place you live. Do you have problems with any of the following? Choose all that apply:: None/None on this list  Utilities: Low Risk (10/01/2023)   Received from [facility]   Utilities    Within the past 12 months, have you been  unable to get utilities(heat, electricity) when it was really needed?: No  Health Literacy: Not on file    ***No family history on file.  Current Outpatient Medications  Medication Sig Dispense Refill   acetaminophen  (TYLENOL ) 325 MG tablet Take 2 tablets (650 mg total) by mouth every 6 (six) hours as needed. 36 tablet 0   albuterol  (VENTOLIN  HFA) 108 (90 Base) MCG/ACT inhaler Inhale 1-2 puffs into the lungs every 6 (six) hours as needed for wheezing or shortness of breath. 8.5 g 0   aspirin 81 MG EC tablet Take 81 mg by mouth daily.     Cholecalciferol (VITAMIN D3) 50 MCG (2000 UT) TABS Take 1 tablet by mouth daily.     citalopram (CELEXA) 20 MG tablet Take 20 mg by mouth daily.     cyclobenzaprine  (FLEXERIL ) 10 MG tablet Take 1 tablet (10 mg total) by mouth 2 (two) times daily as needed for muscle spasms. 20 tablet 0   furosemide  (LASIX ) 20 MG tablet Take 1 tablet (20 mg total) by mouth as needed. 30 tablet 0   lidocaine  (LIDODERM ) 5 % Place 1 patch onto the skin  daily as needed. Remove & Discard patch within 12 hours or as directed by MD 15 patch 0   pantoprazole  (PROTONIX ) 20 MG tablet Take 1 tablet (20 mg total) by mouth daily. 14 tablet 0   traZODone (DESYREL) 100 MG tablet Take 100 mg by mouth at bedtime.     No current facility-administered medications for this visit.    Allergies[1]  REVIEW OF SYSTEMS:  *** [X]  denotes positive finding, [ ]  denotes negative finding Cardiac  Comments:  Chest pain or chest pressure:    Shortness of breath upon exertion:    Short of breath when lying flat:    Irregular heart rhythm:        Vascular    Pain in calf, thigh, or hip brought on by ambulation:    Pain in feet at night that wakes you up from your sleep:     Blood clot in your veins:    Leg swelling:  x       Pulmonary    Oxygen at home:    Productive cough:     Wheezing:         Neurologic    Sudden weakness in arms or legs:     Sudden numbness in arms or legs:     Sudden onset of difficulty speaking or slurred speech:    Temporary loss of vision in one eye:     Problems with dizziness:         Gastrointestinal    Blood in stool:     Vomited blood:         Genitourinary    Burning when urinating:     Blood in urine:        Psychiatric    Major depression:         Hematologic    Bleeding problems:    Problems with blood clotting too easily:        Skin    Rashes or ulcers:        Constitutional    Fever or chills:      PHYSICAL EXAMINATION:  ***  General:  WDWN in NAD; vital signs documented above Gait: Not observed HENT: WNL, normocephalic Pulmonary: normal non-labored breathing without wheezing Cardiac: {Desc; regular/irreg:14544} HR; {With/Without:20273} carotid bruit*** Abdomen: soft, NT, aortic pulse is *** palpable Skin: {With/Without:20273} rashes Vascular Exam/Pulses:  Right Left  Radial {Exam; arterial pulse strength 0-4:30167} {Exam; arterial pulse strength 0-4:30167}  DP {Exam; arterial pulse  strength 0-4:30167} {Exam; arterial pulse strength 0-4:30167}  PT {Exam; arterial pulse strength 0-4:30167} {Exam; arterial pulse strength 0-4:30167}   Extremities: ***  Neurologic: A&O X 3;  moving all extremities equally Psychiatric:  The pt has {Desc; normal/abnormal:11317::Normal} affect.   Non-Invasive Vascular Imaging:   Venous duplex on 04/24/2024: ***    Samuel Wood is a 34 y.o. male who presents with: ***    -pt has *** pedal pulses -in the *** lower extremity, the pt does *** have evidence of DVT.  Pt does ***have venous reflux *** -discussed with pt about wearing *** high *** mmHg compression stockings and pt was measured for these today.   *** -discussed the importance of leg elevation and how to elevate properly - pt is advised to elevate their legs and a diagram is given to them to demonstrate for pt to lay flat on their back with knees elevated and slightly bent with their feet higher than their knees, which puts their feet higher than their heart for 15 minutes per day.  If pt cannot lay flat, advised to lay as flat as possible.  -pt is advised to continue as much walking as possible and avoid sitting or standing for long periods of time.  -discussed importance of weight loss and exercise and that water aerobics would also be beneficial.  -handout with recommendations given -pt will f/u ***   Lucie Apt, [facility] [phone]  Clinic MD:  Serene    [1]  Allergies Allergen Reactions   Aspirin Itching, Swelling and Rash   Clindamycin/Lincomycin Rash   "
